# Patient Record
Sex: Female | Born: 1995 | Race: Black or African American | Hispanic: No | Marital: Single | Smoking: Never smoker
Health system: Southern US, Community
[De-identification: ages and names within clinical notes are randomized; demographics above are authoritative.]

## PROBLEM LIST (undated history)

## (undated) DIAGNOSIS — Z8619 Personal history of other infectious and parasitic diseases: Secondary | ICD-10-CM

## (undated) DIAGNOSIS — F419 Anxiety disorder, unspecified: Secondary | ICD-10-CM

## (undated) DIAGNOSIS — J45909 Unspecified asthma, uncomplicated: Secondary | ICD-10-CM

## (undated) DIAGNOSIS — F32A Depression, unspecified: Secondary | ICD-10-CM

## (undated) DIAGNOSIS — E785 Hyperlipidemia, unspecified: Secondary | ICD-10-CM

## (undated) HISTORY — DX: Personal history of other infectious and parasitic diseases: Z86.19

## (undated) HISTORY — DX: Unspecified asthma, uncomplicated: J45.909

## (undated) HISTORY — DX: Hyperlipidemia, unspecified: E78.5

## (undated) HISTORY — DX: Anxiety disorder, unspecified: F41.9

## (undated) HISTORY — PX: WISDOM TOOTH EXTRACTION: SHX21

---
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
## 2017-02-27 ENCOUNTER — Ambulatory Visit: Payer: 59 | Admitting: Internal Medicine

## 2017-03-03 ENCOUNTER — Encounter: Payer: Self-pay | Admitting: Internal Medicine

## 2017-03-03 ENCOUNTER — Ambulatory Visit (INDEPENDENT_AMBULATORY_CARE_PROVIDER_SITE_OTHER): Payer: Self-pay | Admitting: Internal Medicine

## 2017-03-03 NOTE — Progress Notes (Signed)
ERROR

## 2017-03-09 ENCOUNTER — Ambulatory Visit: Payer: Self-pay | Admitting: Internal Medicine

## 2017-04-02 ENCOUNTER — Ambulatory Visit: Payer: BLUE CROSS/BLUE SHIELD | Admitting: Internal Medicine

## 2017-04-02 ENCOUNTER — Encounter: Payer: Self-pay | Admitting: Internal Medicine

## 2017-04-02 DIAGNOSIS — F321 Major depressive disorder, single episode, moderate: Secondary | ICD-10-CM

## 2017-04-02 MED ORDER — ESCITALOPRAM OXALATE 10 MG PO TABS: 10.0000 mg | ORAL_TABLET | Freq: Every day | ORAL | 1 refills | Status: DC

## 2017-04-02 NOTE — Assessment & Plan Note (Signed)
Rx for lexapro as this did help her some while she was taking it. Explained that she would need to take this until she is in remission and then another 3-6 months so she can be stable. She will continue counseling. Note written today for her school but needs to continue treatment. Follow up in 4-6 weeks.

## 2017-04-02 NOTE — Patient Instructions (Signed)
We have sent in the lexapro to take 1 pill daily. As we talked about this can take up to 1-2 months to help all the way.   We want to see you back in 1-2 months to see how you are doing and make adjustments if we need to.   This is not a medicine you will be on for life. This can be used just to get you through this and get you back into your life.   Keep going to counseling because they can help with medicines to get you more coping skills and set boundaries with family.

## 2017-04-02 NOTE — Progress Notes (Signed)
   Subjective:    Patient ID: Barbara Daniels, female    DOB: 01-03-1996, 21 y.o.   MRN: [id number]  HPI The patient is a 21 YO female coming in for anxiety and depression. She is seeing a therapist at her school who has helped her to cope some. She was seen by PCP in the summer and given lexapro which she took for a couple of weeks, started feeling some better and then stopped. She did not want to feel like she was dependent on medication so did not take. She has little interest in activities, sleeping a lot of the time. 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 She has some troublesome family dynamics that cause her more angst. She is not getting the support from any family members and this is making her more depressed. She has thought about suicide in the past but denies active thoughts about that now. She previous was self-mutilating but is not currently doing this either. She wants to start medication and achieve her goals.   Review of Systems  Constitutional: Positive for activity change, appetite change and fatigue. Negative for chills, fever and unexpected weight change.  Respiratory: Negative for cough, chest tightness and shortness of breath.   Cardiovascular: Negative for chest pain, palpitations and leg swelling.  Gastrointestinal: Negative for abdominal distention, abdominal pain, constipation, diarrhea, nausea and vomiting.  Musculoskeletal: Negative.   Skin: Negative.   Neurological: Negative.   Psychiatric/Behavioral: Positive for behavioral problems, decreased concentration, dysphoric mood and sleep disturbance. Negative for self-injury and suicidal ideas. The patient is nervous/anxious.       Objective:   Physical Exam  Constitutional: She is oriented to person, place, and time. She appears well-developed and well-nourished.  HENT:  Head: Normocephalic and atraumatic.  Eyes: EOM are normal.  Neck: Normal range of motion.    Cardiovascular: Normal rate and regular rhythm.  Pulmonary/Chest: Effort normal and breath sounds normal. No respiratory distress. She has no wheezes. She has no rales.  Abdominal: Soft. Bowel sounds are normal. She exhibits no distension. There is no tenderness. There is no rebound.  Musculoskeletal: She exhibits no edema.  Neurological: She is alert and oriented to person, place, and time. Coordination normal.  Skin: Skin is warm and dry.  Psychiatric:  Some appropriate distress during the visit and flat affect   Vitals:   04/02/17 0955  BP: 110/80  Pulse: 67  Temp: 98.1 F (36.7 C)  TempSrc: Oral  SpO2: 98%  Weight: 233 lb (105.7 kg)  Height: 5' 9.01" (1.753 m)      Assessment & Plan:  Visit time 25 minutes: greater than 50% of that time was spent in face to face counseling and coordination of care with the patient: counseled about setting boundaries with family and coping skills as well as the nature and course of depression medications and expected length of treatment.

## 2017-05-01 ENCOUNTER — Ambulatory Visit: Payer: BLUE CROSS/BLUE SHIELD | Admitting: Nurse Practitioner

## 2017-06-26 ENCOUNTER — Ambulatory Visit (INDEPENDENT_AMBULATORY_CARE_PROVIDER_SITE_OTHER): Payer: BLUE CROSS/BLUE SHIELD | Admitting: Family Medicine

## 2017-06-26 ENCOUNTER — Encounter: Payer: Self-pay | Admitting: Family Medicine

## 2017-06-26 ENCOUNTER — Telehealth: Payer: Self-pay | Admitting: Family

## 2017-06-26 VITALS — BP 118/76 | HR 100 | Temp 98.7°F | Ht 69.02 in | Wt 240.0 lb

## 2017-06-26 DIAGNOSIS — J020 Streptococcal pharyngitis: Secondary | ICD-10-CM | POA: Diagnosis not present

## 2017-06-26 LAB — POCT RAPID STREP A (OFFICE): Rapid Strep A Screen: NEGATIVE

## 2017-06-26 MED ORDER — CLARITHROMYCIN 500 MG PO TABS: 500.0000 mg | ORAL_TABLET | Freq: Two times a day (BID) | ORAL | 0 refills | Status: AC

## 2017-06-26 MED ORDER — ERYTHROMYCIN BASE 500 MG PO TABS: 500.0000 mg | ORAL_TABLET | Freq: Three times a day (TID) | ORAL | 0 refills | Status: DC

## 2017-06-26 NOTE — Telephone Encounter (Signed)
Copied from CRM [id number]. Topic: Quick Communication - See Telephone Encounter >> Jun 26, 2017  1:35 PM Windy KalataMichael, Laylaa Guevarra L, NT wrote: CRM for notification. See Telephone encounter for:  06/26/17.  Patient grandmother is calling and states patient was prescribed erythromycin base (E-MYCIN) 500 MG tablet today 06/26/17. The meidcation is $120 and she is needing something cheaper. Patient was seen by Dr. Doreene BurkeKremer.   [facility] [id number] [place] - [address]  [address] [place] [id number]  Phone: [phone] Fax: [phone]

## 2017-06-26 NOTE — Addendum Note (Signed)
Addended by: Nadene RubinsKREMER, WILLIAM A on: 06/26/2017 03:00 PM   Modules accepted: Orders

## 2017-06-26 NOTE — Patient Instructions (Addendum)
Pharyngitis Pharyngitis is redness, pain, and swelling (inflammation) of the throat (pharynx). It is a very common cause of sore throat. Pharyngitis can be caused by a bacteria, but it is usually caused by a virus. Most cases of pharyngitis get better on their own without treatment. What are the causes? This condition may be caused by:  Infection by viruses (viral). Viral pharyngitis spreads from person to person (is contagious) through coughing, sneezing, and sharing of personal items or utensils such as cups, forks, spoons, and toothbrushes.  Infection by bacteria (bacterial). Bacterial pharyngitis may be spread by touching the nose or face after coming in contact with the bacteria, or through more intimate contact, such as kissing.  Allergies. Allergies can cause buildup of mucus in the throat (post-nasal drip), leading to inflammation and irritation. Allergies can also cause blocked nasal passages, forcing breathing through the mouth, which dries and irritates the throat.  What increases the risk? You are more likely to develop this condition if:  You are 5-24 years old.  You are exposed to crowded environments such as daycare, school, or dormitory living.  You live in a cold climate.  You have a weakened disease-fighting (immune) system.  What are the signs or symptoms? Symptoms of this condition vary by the cause (viral, bacterial, or allergies) and can include:  Sore throat.  Fatigue.  Low-grade fever.  Headache.  Joint pain and muscle aches.  Skin rashes.  Swollen glands in the throat (lymph nodes).  Plaque-like film on the throat or tonsils. This is often a symptom of bacterial pharyngitis.  Vomiting.  Stuffy nose (nasal congestion).  Cough.  Red, itchy eyes (conjunctivitis).  Loss of appetite.  How is this diagnosed? This condition is often diagnosed based on your medical history and a physical exam. Your health care provider will ask you questions about  your illness and your symptoms. A swab of your throat may be done to check for bacteria (rapid strep test). Other lab tests may also be done, depending on the suspected cause, but these are rare. How is this treated? This condition usually gets better in 3-4 days without medicine. Bacterial pharyngitis may be treated with antibiotic medicines. Follow these instructions at home:  Take over-the-counter and prescription medicines only as told by your health care provider. ? If you were prescribed an antibiotic medicine, take it as told by your health care provider. Do not stop taking the antibiotic even if you start to feel better. ? Do not give children aspirin because of the association with Reye syndrome.  Drink enough water and fluids to keep your urine clear or pale yellow.  Get a lot of rest.  Gargle with a salt-water mixture 3-4 times a day or as needed. To make a salt-water mixture, completely dissolve -1 tsp of salt in 1 cup of warm water.  If your health care provider approves, you may use throat lozenges or sprays to soothe your throat. Contact a health care provider if:  You have large, tender lumps in your neck.  You have a rash.  You cough up green, yellow-brown, or bloody spit. Get help right away if:  Your neck becomes stiff.  You drool or are unable to swallow liquids.  You cannot drink or take medicines without vomiting.  You have severe pain that does not go away, even after you take medicine.  You have trouble breathing, and it is not caused by a stuffy nose.  You have new pain and swelling in your joints   such as the knees, ankles, wrists, or elbows. Summary  Pharyngitis is redness, pain, and swelling (inflammation) of the throat (pharynx).  While pharyngitis can be caused by a bacteria, the most common causes are viral.  Most cases of pharyngitis get better on their own without treatment.  Bacterial pharyngitis is treated with antibiotic medicines. This  information is not intended to replace advice given to you by your health care provider. Make sure you discuss any questions you have with your health care provider. Document Released: 03/31/2005 Document Revised: 05/06/2016 Document Reviewed: 05/06/2016 Elsevier Interactive Patient Education  2018 [employer]  Strep Throat Strep throat is an infection of the throat. It is caused by germs. Strep throat spreads from person to person because of coughing, sneezing, or close contact. Follow these instructions at home: Medicines  Take over-the-counter and prescription medicines only as told by your doctor.  Take your antibiotic medicine as told by your doctor. Do not stop taking the medicine even if you feel better.  Have family members who also have a sore throat or fever go to a doctor. Eating and drinking  Do not share food, drinking cups, or personal items.  Try eating soft foods until your sore throat feels better.  Drink enough fluid to keep your pee (urine) clear or pale yellow. General instructions  Rinse your mouth (gargle) with a salt-water mixture 3-4 times per day or as needed. To make a salt-water mixture, stir -1 tsp of salt into 1 cup of warm water.  Make sure that all people in your house wash their hands well.  Rest.  Stay home from school or work until you have been taking antibiotics for 24 hours.  Keep all follow-up visits as told by your doctor. This is important. Contact a doctor if:  Your neck keeps getting bigger.  You get a rash, cough, or earache.  You cough up thick liquid that is green, yellow-brown, or bloody.  You have pain that does not get better with medicine.  Your problems get worse instead of getting better.  You have a fever. Get help right away if:  You throw up (vomit).  You get a very bad headache.  You neck hurts or it feels stiff.  You have chest pain or you are short of breath.  You have drooling, very bad throat pain,  or changes in your voice.  Your neck is swollen or the skin gets red and tender.  Your mouth is dry or you are peeing less than normal.  You keep feeling more tired or it is hard to wake up.  Your joints are red or they hurt. This information is not intended to replace advice given to you by your health care provider. Make sure you discuss any questions you have with your health care provider. Document Released: 09/17/2007 Document Revised: 11/28/2015 Document Reviewed: 07/24/2014 Elsevier Interactive Patient Education  2018 [employer].  Infectious Mononucleosis Infectious mononucleosis is an infection that is caused by a virus. This illness is often called "mono." You can get mono from close contact with someone who is infected (it is contagious). If you have mono, you may feel tired and have a sore throat, a headache, or a fever. Mono is usually not serious, but some people may need to be treated for it in the hospital. Follow these instructions at home: Medicines  Take over-the-counter and prescription medicines only as told by your doctor.  Do not take ampicillin or amoxicillin. This may cause a rash.  If you are under 18, do not take aspirin. Activity  Rest as needed.  Do not do any of the following activities until your doctor says that they are safe for you: ? Contact sports. You may need to wait a month or longer before you play sports. ? Exercise that requires a lot of energy. ? Lifting heavy things.  Slowly go back to your normal activities after your fever is gone, or when your doctor says that you can. Be sure to rest when you get tired. Preventing infectious mononucleosis  Avoid contact with people who have mono. An infected person may not seem sick, but he or she can still spread the virus.  Avoid sharing forks, spoons, knives (utensils), drinking cups, or toothbrushes.  Wash your hands often with soap and water. If you cannot use soap and water, use hand  sanitizer.  Use the inside of your elbow to cover your mouth when you cough or sneeze. General instructions  Avoid kissing or sharing forks, spoons, knives, or drinking cups until your doctor approves.  Drink enough fluid to keep your pee (urine) clear or pale yellow.  Do not drink alcohol.  If you have a sore throat: ? Rinse your mouth (gargle) with a salt-water mixture 3-4 times a day or as needed. To make a salt-water mixture, completely dissolve -1 tsp of salt in 1 cup of warm water. ? Eat soft foods. Cold foods such as ice cream or frozen ice pops can help your throat feel better. ? Try sucking on hard candy.  Wash your hands often with soap and water. If you cannot use soap and water, use hand sanitizer. Contact a doctor if:  Your fever is not gone after 10 days.  You have swelling by your jaw or neck (swollen lymph nodes), and the swelling does not go away after 4 weeks.  Your activity level is not back to normal after 2 months.  Your skin or the white parts of your eyes turn yellow (jaundice).  You have trouble pooping (have constipation). This may mean that you: ? Poop (have a bowel movement) fewer times in a week than normal. ? Have a hard time pooping. ? Have poop that is dry, hard, or bigger than normal. Get help right away if:  You have very bad pain in your: ? Belly (abdomen). ? Shoulder.  You are drooling.  You have trouble swallowing.  You have trouble breathing.  You have a stiff neck.  You have a very bad headache.  You cannot stop throwing up (vomiting).  You have jerky movements that you cannot control (seizures).  You are confused.  You have trouble with balance.  Your nose or gums start to bleed.  You have signs of body fluid loss (dehydration). These may include: ? Weakness. ? Sunken eyes. ? Pale skin. ? Dry mouth. ? Fast breathing or heartbeat. Summary  Infectious mononucleosis, or "mono," is an infection that is caused by a  virus.  Mono is usually not serious, but some people may need to be treated for it in the hospital.  You should not play contact sports or lift heavy things until your doctor says that you can.  Wash your hands often with soap and water. If you cannot use soap and water, use hand sanitizer. This information is not intended to replace advice given to you by your health care provider. Make sure you discuss any questions you have with your health care provider. Document Released: 03/19/2009 Document Revised:  12/18/2015 Document Reviewed: 12/18/2015 Elsevier Interactive Patient Education  2017 [employer].

## 2017-06-26 NOTE — Telephone Encounter (Signed)
rx for biaxin sent to pharm.

## 2017-06-26 NOTE — Progress Notes (Addendum)
Subjective:  Patient ID: Barbara Daniels, female    DOB: [birth year]  Age: 22 y.o. MRN: [id number]  CC: body aches, sore throat   HPI Barbara Daniels presents for evaluation of a 2-day history of sore throat with an intermittent headache and poor appetite.  Some nausea but no vomiting.  Little cough.  She has had myalgias as well.  Outpatient Medications Prior to Visit  Medication Sig Dispense Refill  . albuterol (PROVENTIL HFA;VENTOLIN HFA) 108 (90 Base) MCG/ACT inhaler Inhale 2 puffs into the lungs every 6 (six) hours as needed for wheezing or shortness of breath. 1 Inhaler 0  . escitalopram (LEXAPRO) 10 MG tablet Take 1 tablet (10 mg total) by mouth daily. 30 tablet 1  . etonogestrel (NEXPLANON) 68 MG IMPL implant 1 each by Subdermal route once.    . fluticasone (FLOVENT HFA) 44 MCG/ACT inhaler Inhale 2 puffs into the lungs 2 (two) times daily. 1 Inhaler 2   No facility-administered medications prior to visit.     ROS Review of Systems  Constitutional: Positive for fatigue and fever. Negative for chills.  HENT: Positive for sore throat and voice change. Negative for trouble swallowing.   Eyes: Negative for photophobia and visual disturbance.  Respiratory: Positive for cough. Negative for shortness of breath and wheezing.   Cardiovascular: Negative.   Gastrointestinal: Positive for nausea. Negative for abdominal distention, abdominal pain and vomiting.  Musculoskeletal: Positive for myalgias. Negative for arthralgias.  Skin: Negative for rash.  Allergic/Immunologic: Negative for immunocompromised state.  Neurological: Positive for headaches.  Hematological: Does not bruise/bleed easily.  Psychiatric/Behavioral: Negative.     Objective:  BP 118/76 (BP Location: Right Arm, Patient Position: Sitting, Cuff Size: Normal)   Pulse 100   Temp 98.7 F (37.1 C) (Oral)   Ht 5' 9.02" (1.753 m)   Wt 240 lb (108.9 kg)   SpO2 97%   BMI 35.42 kg/m   BP Readings from Last 3 Encounters:    06/26/17 118/76 (65 %, Z = 0.40 /  86 %, Z = 1.06)*  04/02/17 110/80 (30 %, Z = -0.53 /  92 %, Z = 1.39)*  11/21/16 127/82 (89 %, Z = 1.24 /  96 %, Z = 1.70)*   *BP percentiles are based on the August 2017 AAP Clinical Practice Guideline for girls    Wt Readings from Last 3 Encounters:  06/26/17 240 lb (108.9 kg) (>99 %, Z= 2.43)*  04/02/17 233 lb (105.7 kg) (>99 %, Z= 2.35)*  11/20/16 241 lb (109.3 kg) (>99 %, Z= 2.42)*   * Growth percentiles are based on CDC (Girls, 2-20 Years) data.    Physical Exam  Constitutional: She is oriented to person, place, and time. She appears well-developed and well-nourished. No distress.  HENT:  Head: Normocephalic and atraumatic.  Right Ear: External ear normal.  Left Ear: External ear normal.  Mouth/Throat: Mucous membranes are normal. Oropharyngeal exudate, posterior oropharyngeal edema and posterior oropharyngeal erythema present. No tonsillar abscesses.  Eyes: Conjunctivae are normal. Pupils are equal, round, and reactive to light. Right eye exhibits no discharge. Left eye exhibits no discharge. No scleral icterus.  Neck: Normal range of motion. Neck supple. No JVD present. No tracheal deviation present. No thyromegaly present.  Cardiovascular: Normal rate, regular rhythm and normal heart sounds.  Pulmonary/Chest: Effort normal and breath sounds normal. No stridor. No respiratory distress. She has no wheezes. She has no rales. She exhibits no tenderness.  Abdominal: Bowel sounds are normal.  Lymphadenopathy:  She has cervical adenopathy.  Neurological: She is alert and oriented to person, place, and time.  Skin: Skin is warm and dry. She is not diaphoretic.  Psychiatric: She has a normal mood and affect. Her behavior is normal.    Lab Results  Component Value Date   WBC 6.1 11/20/2016   HGB 13.4 11/20/2016   HCT 38.2 11/20/2016   PLT 277 11/20/2016   GLUCOSE 88 11/20/2016   CHOL 146 06/27/2016   TRIG 55.0 06/27/2016   HDL 42.80  06/27/2016   LDLCALC 92 06/27/2016   ALT 14 06/27/2016   AST 15 06/27/2016   NA 138 11/20/2016   K 3.9 11/20/2016   CL 105 11/20/2016   CREATININE 0.82 11/20/2016   BUN 13 11/20/2016   CO2 26 11/20/2016    No results found.  Assessment & Plan:   Barbara Daniels was seen today for body aches, sore throat.  Diagnoses and all orders for this visit:  Pharyngitis due to Streptococcus species -     POCT rapid strep A -     Discontinue: erythromycin base (E-MYCIN) 500 MG tablet; Take 1 tablet (500 mg total) by mouth 3 (three) times daily for 10 days. -     clarithromycin (BIAXIN) 500 MG tablet; Take 1 tablet (500 mg total) by mouth 2 (two) times daily for 10 days.   I have discontinued Barbara Daniels's erythromycin base. I am also having her start on clarithromycin. Additionally, I am having her maintain her etonogestrel, albuterol, fluticasone, and escitalopram.  Meds ordered this encounter  Medications  . DISCONTD: erythromycin base (E-MYCIN) 500 MG tablet    Sig: Take 1 tablet (500 mg total) by mouth 3 (three) times daily for 10 days.    Dispense:  30 tablet    Refill:  0  . clarithromycin (BIAXIN) 500 MG tablet    Sig: Take 1 tablet (500 mg total) by mouth 2 (two) times daily for 10 days.    Dispense:  20 tablet    Refill:  0   Strep throat versus early mono.  Treated with a rhythmical erythromycin.  Patient is pen allergic.  Follow-up in 1 week or sooner if worse.  Consider Monospot test as needed. Erythromycin not covered by insurance. Will send biaxin instead. ? Early mono. Fu for monospot if not imprved on biaxin.  Follow-up: Return in about 1 week (around 07/03/2017), or if symptoms worsen or fail to improve.  Mliss Sax, MD

## 2017-08-18 ENCOUNTER — Ambulatory Visit (INDEPENDENT_AMBULATORY_CARE_PROVIDER_SITE_OTHER): Payer: BLUE CROSS/BLUE SHIELD

## 2017-08-18 DIAGNOSIS — Z111 Encounter for screening for respiratory tuberculosis: Secondary | ICD-10-CM

## 2017-08-21 ENCOUNTER — Encounter: Payer: Self-pay | Admitting: Family

## 2017-08-21 ENCOUNTER — Other Ambulatory Visit (INDEPENDENT_AMBULATORY_CARE_PROVIDER_SITE_OTHER): Payer: BLUE CROSS/BLUE SHIELD

## 2017-08-21 ENCOUNTER — Ambulatory Visit (INDEPENDENT_AMBULATORY_CARE_PROVIDER_SITE_OTHER): Payer: BLUE CROSS/BLUE SHIELD | Admitting: Family

## 2017-08-21 VITALS — BP 116/80 | HR 86 | Temp 97.7°F | Ht 69.0 in | Wt 236.0 lb

## 2017-08-21 DIAGNOSIS — J4540 Moderate persistent asthma, uncomplicated: Secondary | ICD-10-CM | POA: Diagnosis not present

## 2017-08-21 DIAGNOSIS — Z0000 Encounter for general adult medical examination without abnormal findings: Secondary | ICD-10-CM | POA: Diagnosis not present

## 2017-08-21 DIAGNOSIS — Z13220 Encounter for screening for lipoid disorders: Secondary | ICD-10-CM

## 2017-08-21 DIAGNOSIS — E049 Nontoxic goiter, unspecified: Secondary | ICD-10-CM

## 2017-08-21 DIAGNOSIS — Z113 Encounter for screening for infections with a predominantly sexual mode of transmission: Secondary | ICD-10-CM

## 2017-08-21 DIAGNOSIS — Z23 Encounter for immunization: Secondary | ICD-10-CM | POA: Diagnosis not present

## 2017-08-21 LAB — LIPID PANEL
Cholesterol: 179 mg/dL (ref 0–200)
HDL: 41.8 mg/dL (ref >39.00)
LDL Cholesterol: 106 mg/dL — ABNORMAL HIGH (ref 0–99)
NONHDL: 137.08
TRIGLYCERIDES: 155 mg/dL — AB (ref 0.0–149.0)
Total CHOL/HDL Ratio: 4
VLDL: 31 mg/dL (ref 0.0–40.0)

## 2017-08-21 LAB — CBC WITH DIFFERENTIAL/PLATELET
BASOS PCT: 1.4 % (ref 0.0–3.0)
Basophils Absolute: 0.1 10*3/uL (ref 0.0–0.1)
EOS ABS: 0.1 10*3/uL (ref 0.0–0.7)
Eosinophils Relative: 1.1 % (ref 0.0–5.0)
HEMATOCRIT: 40.8 % (ref 36.0–46.0)
Hemoglobin: 14.4 g/dL (ref 12.0–15.0)
LYMPHS PCT: 35.2 % (ref 12.0–46.0)
Lymphs Abs: 2.5 10*3/uL (ref 0.7–4.0)
MCHC: 35.4 g/dL (ref 30.0–36.0)
MCV: 90.6 fl (ref 78.0–100.0)
MONO ABS: 0.7 10*3/uL (ref 0.1–1.0)
Monocytes Relative: 10.5 % (ref 3.0–12.0)
NEUTROS ABS: 3.7 10*3/uL (ref 1.4–7.7)
NEUTROS PCT: 51.8 % (ref 43.0–77.0)
PLATELETS: 280 10*3/uL (ref 150.0–400.0)
RBC: 4.5 Mil/uL (ref 3.87–5.11)
RDW: 12.9 % (ref 11.5–14.6)
WBC: 7.1 10*3/uL (ref 4.5–10.5)

## 2017-08-21 LAB — COMPREHENSIVE METABOLIC PANEL
ALBUMIN: 4.1 g/dL (ref 3.5–5.2)
ALK PHOS: 58 U/L (ref 39–117)
ALT: 14 U/L (ref 0–35)
AST: 15 U/L (ref 0–37)
BUN: 14 mg/dL (ref 6–23)
CALCIUM: 9.3 mg/dL (ref 8.4–10.5)
CHLORIDE: 102 meq/L (ref 96–112)
CO2: 28 mEq/L (ref 19–32)
CREATININE: 0.89 mg/dL (ref 0.40–1.20)
GFR: 103.83 mL/min (ref >60.00)
Glucose, Bld: 87 mg/dL (ref 70–99)
Potassium: 4.1 mEq/L (ref 3.5–5.1)
Sodium: 139 mEq/L (ref 135–145)
TOTAL PROTEIN: 7.2 g/dL (ref 6.0–8.3)
Total Bilirubin: 0.6 mg/dL (ref 0.2–1.2)

## 2017-08-21 LAB — TSH: TSH: 1.5 u[IU]/mL (ref 0.35–5.50)

## 2017-08-21 MED ORDER — FLUTICASONE PROPIONATE HFA 44 MCG/ACT IN AERO: 2.0000 | INHALATION_SPRAY | Freq: Two times a day (BID) | RESPIRATORY_TRACT | 2 refills | Status: DC

## 2017-08-21 MED ORDER — ALBUTEROL SULFATE HFA 108 (90 BASE) MCG/ACT IN AERS: 2.0000 | INHALATION_SPRAY | Freq: Four times a day (QID) | RESPIRATORY_TRACT | 2 refills | Status: DC | PRN

## 2017-08-21 NOTE — Progress Notes (Signed)
Barbara Daniels is a 22 y.o. female with the following history as recorded in EpicCare:  Patient Active Problem List   Diagnosis Date Noted  . Pharyngitis due to Streptococcus species 06/26/2017  . Current moderate episode of major depressive disorder without prior episode ([place]) 10/30/2016  . Routine adult health maintenance 06/27/2016  . Moderate persistent asthma without complication 75/44/9201  . Class 2 obesity due to excess calories without serious comorbidity with body mass index (BMI) of 35.0 to 35.9 in adult 06/27/2016    Current Outpatient Medications  Medication Sig Dispense Refill  . albuterol (PROVENTIL HFA;VENTOLIN HFA) 108 (90 Base) MCG/ACT inhaler Inhale 2 puffs into the lungs every 6 (six) hours as needed for wheezing or shortness of breath. 1 Inhaler 2  . etonogestrel (NEXPLANON) 68 MG IMPL implant 1 each by Subdermal route once.    . fluticasone (FLOVENT HFA) 44 MCG/ACT inhaler Inhale 2 puffs into the lungs 2 (two) times daily. 1 Inhaler 2   No current facility-administered medications for this visit.     Allergies: Penicillins  Past Medical History:  Diagnosis Date  . Asthma   . History of chickenpox   . Hyperlipidemia     Past Surgical History:  Procedure Laterality Date  . WISDOM TOOTH EXTRACTION      Family History  Problem Relation Age of Onset  . Diabetes Father   . Hypertension Father   . Diabetes Maternal Grandmother   . Arthritis Paternal Grandmother   . Heart failure Paternal Grandfather     Social History   Tobacco Use  . Smoking status: Never Smoker  . Smokeless tobacco: Never Used  Substance Use Topics  . 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  Head: Normocephalic and atraumatic  Eyes: Sclera and conjunctiva clear; pupils round and reactive to light; extraocular movements intact  Ears: External normal; canals clear; tympanic membranes normal  Oropharynx: Pink, supple. No suspicious lesions  Neck: Supple with thyromegaly, no adenopathy  Lungs: Respirations unlabored; clear to auscultation bilaterally without wheeze, rales, rhonchi  CVS exam: normal rate and regular rhythm.  Abdomen: Soft; nontender; nondistended; normoactive bowel sounds; no masses or hepatosplenomegaly  Musculoskeletal: No deformities; no active joint inflammation  Extremities: No edema, cyanosis, clubbing  Vessels: Symmetric bilaterally  Neurologic: Alert and oriented; speech intact; face symmetrical; moves all extremities well; CNII-XII intact without focal deficit  Assessment:  1. Enlarged thyroid   2. Moderate persistent asthma without complication   3. PE (physical exam), annual   4. Lipid screening   5. Screen for STD (sexually transmitted disease)     Plan:  Labs and refills updated as requested; paperwork completed  for employer; encouraged for GYN follow-up; follow up to be determined.   No follow-ups on file.  Orders Placed This Encounter  Procedures  . GC/Chlamydia Probe Amp([facility])    Standing Status:   Future    Number of Occurrences:   1    Standing Expiration Date:   08/22/2018  . US Soft Tissue Head/Neck    Standing Status:   Future    Standing Expiration Date:   10/22/2018     Order Specific Question:   Reason for Exam (SYMPTOM  OR DIAGNOSIS REQUIRED)    Answer:   thyroid enlarged    Order Specific Question:   Preferred imaging location?    Answer:   [facility]  . Tdap vaccine greater than or equal to 7yo IM  . CBC w/Diff    Standing Status:   Future    Number of Occurrences:   1    Standing Expiration Date:   08/21/2018  . Comp Met (CMET)    Standing Status:   Future    Number of Occurrences:   1    Standing Expiration Date:   08/21/2018  . Lipid panel    Standing Status:   Future    Number of Occurrences:   1    Standing Expiration Date:   08/22/2018  . HIV antibody    Standing Status:   Future    Number of Occurrences:   1    Standing Expiration Date:   08/22/2018  . RPR    Standing Status:   Future    Number of Occurrences:   1    Standing Expiration Date:   08/21/2018  . TSH    Standing Status:   Future    Number of Occurrences:   1    Standing Expiration Date:   08/21/2018    Requested Prescriptions   Signed Prescriptions Disp Refills  . fluticasone (FLOVENT HFA) 44 MCG/ACT inhaler 1 Inhaler 2    Sig: Inhale 2 puffs into the lungs 2 (two) times daily.  Marland Kitchen albuterol (PROVENTIL HFA;VENTOLIN HFA) 108 (90 Base) MCG/ACT inhaler 1 Inhaler 2    Sig: Inhale 2 puffs into the lungs every 6 (six) hours as needed for wheezing or shortness of breath.

## 2017-08-24 LAB — RPR: RPR Ser Ql: NONREACTIVE

## 2017-08-24 LAB — HIV ANTIBODY (ROUTINE TESTING W REFLEX): HIV 1&2 Ab, 4th Generation: NONREACTIVE

## 2017-08-25 ENCOUNTER — Encounter: Payer: Self-pay | Admitting: Family

## 2017-08-25 ENCOUNTER — Ambulatory Visit (INDEPENDENT_AMBULATORY_CARE_PROVIDER_SITE_OTHER): Payer: BLUE CROSS/BLUE SHIELD | Admitting: Family

## 2017-08-25 VITALS — BP 118/76 | HR 70 | Temp 99.0°F | Ht 69.0 in | Wt 239.1 lb

## 2017-08-25 DIAGNOSIS — Z113 Encounter for screening for infections with a predominantly sexual mode of transmission: Secondary | ICD-10-CM | POA: Diagnosis not present

## 2017-08-25 DIAGNOSIS — A549 Gonococcal infection, unspecified: Secondary | ICD-10-CM

## 2017-08-25 LAB — GC/CHLAMYDIA PROBE AMP
Chlamydia trachomatis, NAA: NEGATIVE
Neisseria gonorrhoeae by PCR: POSITIVE — AB

## 2017-08-25 MED ORDER — AZITHROMYCIN 500 MG PO TABS: 1000.0000 mg | ORAL_TABLET | Freq: Two times a day (BID) | ORAL | 0 refills | Status: DC

## 2017-08-25 NOTE — Progress Notes (Signed)
Barbara Daniels is a 22 y.o. female with the following history as recorded in EpicCare:  Patient Active Problem List   Diagnosis Date Noted  . Pharyngitis due to Streptococcus species 06/26/2017  . 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 Routine adult health maintenance 06/27/2016  . Moderate persistent asthma without complication 06/27/2016  . Class 2 obesity due to excess calories without serious comorbidity with body mass index (BMI) of 35.0 to 35.9 in adult 06/27/2016    Current Outpatient Medications  Medication Sig Dispense Refill  . albuterol (PROVENTIL HFA;VENTOLIN HFA) 108 (90 Base) MCG/ACT inhaler Inhale 2 puffs into the lungs every 6 (six) hours as needed for wheezing or shortness of breath. 1 Inhaler 2  . etonogestrel (NEXPLANON) 68 MG IMPL implant 1 each by Subdermal route once.    . fluticasone (FLOVENT HFA) 44 MCG/ACT inhaler Inhale 2 puffs into the lungs 2 (two) times daily. 1 Inhaler 2  . azithromycin (ZITHROMAX) 500 MG tablet Take 2 tablets (1,000 mg total) by mouth 2 (two) times daily. 4 tablet 0   No current facility-administered medications for this visit.     Allergies: Penicillins  Past Medical History:  Diagnosis Date  . Asthma   . History of chickenpox   . Hyperlipidemia     Past Surgical History:  Procedure Laterality Date  . WISDOM TOOTH EXTRACTION      Family History  Problem Relation Age of Onset  . Diabetes Father   . Hypertension Father   . Diabetes Maternal Grandmother   . Arthritis Paternal Grandmother   . Heart failure Paternal Grandfather     Social History   Tobacco Use  . Smoking status: Never Smoker  . Smokeless tobacco: Never Used  Substance Use Topics  . Alcohol use: No    Subjective:  Patient presents with concerns for "just not feeling well recently." Notes symptoms have been present for the past 2 weeks; complaining of recurrent headaches/ body pains and swollen lymph  nodes; does have Nexplanon in place;  Upon further questioning with patient, she does admit to recent episode of unprotected sex in the past few weeks; denies any pelvic pain, vaginal discharge, urinary pain or frequent urination;    Objective:  Vitals:   08/25/17 1458  BP: 118/76  Pulse: 70  Temp: 99 F (37.2 C)  TempSrc: Oral  SpO2: 98%  Weight: 239 lb 1.3 oz (108.4 kg)  Height:  (1.753 m)    General: Well developed, well nourished, in no acute distress  Skin : Warm and dry.  Head: Normocephalic and atraumatic  Neck: Supple without thyromegaly, adenopathy  Lungs: Respirations unlabored; clear to auscultation bilaterally without wheeze, rales, rhonchi  CVS exam: normal rate and regular rhythm.  Neurologic: Alert and oriented; speech intact; face symmetrical; moves all extremities well; CNII-XII intact without focal deficit  Assessment:  1. Gonorrhea in female   2. Screen for STD (sexually transmitted disease)     Plan:  Reviewed recent labs with patient; discussed + gonorrhea test; due to nature of patient's PCN allergy ( hives and rash), am hesitant to give her injection of Rocephin; will treat with 2gm of Zithromax per [facility] guidelines; she understands that Health Department will be contacting her; she will abstain for at least 2 weeks; stressed need to use condoms with all partners; she will plan to get her G/C test repeated in 2-3 weeks to ensure resolution.    No follow-ups on file.  Orders Placed This Encounter  Procedures  . GC/Chlamydia Probe Amp([facility])    Requested Prescriptions   Signed Prescriptions Disp Refills  . azithromycin (ZITHROMAX) 500 MG tablet 4 tablet 0    Sig: Take 2 tablets (1,000 mg total) by mouth 2 (two) times daily.

## 2017-09-04 ENCOUNTER — Other Ambulatory Visit: Payer: BLUE CROSS/BLUE SHIELD

## 2017-09-04 ENCOUNTER — Other Ambulatory Visit: Payer: Self-pay | Admitting: Family

## 2017-09-04 ENCOUNTER — Telehealth: Payer: Self-pay

## 2017-09-04 DIAGNOSIS — A549 Gonococcal infection, unspecified: Secondary | ICD-10-CM

## 2017-09-04 NOTE — Telephone Encounter (Signed)
Spoke with Gearldine Bienenstock today from [facility] Department regarding patient's positive STD screening. She was calling regarding new guidelines for patients who tested positive for Gonorrhea and wanted to know what treatment patient had been given and we carried gentamicin. Did make Brandy aware that we do not carry Gentamicin in the office but that I would contact patient to have her come back in for recheck of labs to make sure STD had cleared up and to touch basis with her to let her know results and if she needed to call patient for appointment so she could receive the Gentamicin. Brandy's call back number is [phone]. Called patient and left message for her to return call to clinic and to come back and repeat lab work. CRM created about coming back for labs incase she calls back.

## 2017-09-08 ENCOUNTER — Telehealth: Payer: Self-pay | Admitting: Family

## 2017-09-08 ENCOUNTER — Encounter: Payer: Self-pay | Admitting: Family

## 2017-09-08 NOTE — Telephone Encounter (Signed)
Patient spoke with team health on 5/25 stating she was returning phone call to the office that she had missed.  States she dropped a "sample" off on 5/24.

## 2017-09-08 NOTE — Telephone Encounter (Signed)
Called and left message for patient to come by lab and redo test. We will call her with results once they come back.

## 2017-09-09 ENCOUNTER — Other Ambulatory Visit: Payer: BLUE CROSS/BLUE SHIELD

## 2017-09-10 ENCOUNTER — Other Ambulatory Visit: Payer: BLUE CROSS/BLUE SHIELD

## 2017-09-10 DIAGNOSIS — A549 Gonococcal infection, unspecified: Secondary | ICD-10-CM

## 2017-09-11 LAB — GC/CHLAMYDIA PROBE AMP
Chlamydia trachomatis, NAA: NEGATIVE
NEISSERIA GONORRHOEAE BY PCR: NEGATIVE

## 2017-09-14 NOTE — Telephone Encounter (Signed)
Spoke with Gearldine BienenstockBrandy today and informed her of lab results from 09/10/17 that patient was now negative.

## 2017-10-05 ENCOUNTER — Other Ambulatory Visit: Payer: Self-pay

## 2017-10-05 ENCOUNTER — Emergency Department (HOSPITAL_COMMUNITY)
Admission: EM | Admit: 2017-10-05 | Discharge: 2017-10-06 | Disposition: A | Discharge status: Home / Self Care | Payer: BLUE CROSS/BLUE SHIELD | Attending: Emergency Medicine | Admitting: Emergency Medicine

## 2017-10-05 ENCOUNTER — Encounter (HOSPITAL_COMMUNITY): Payer: Self-pay | Admitting: Emergency Medicine

## 2017-10-05 DIAGNOSIS — Z79899 Other long term (current) drug therapy: Secondary | ICD-10-CM | POA: Insufficient documentation

## 2017-10-05 DIAGNOSIS — J4540 Moderate persistent asthma, uncomplicated: Secondary | ICD-10-CM | POA: Insufficient documentation

## 2017-10-05 DIAGNOSIS — L02412 Cutaneous abscess of left axilla: Secondary | ICD-10-CM | POA: Diagnosis not present

## 2017-10-05 DIAGNOSIS — R222 Localized swelling, mass and lump, trunk: Secondary | ICD-10-CM | POA: Diagnosis present

## 2017-10-05 MED ORDER — LIDOCAINE-EPINEPHRINE (PF) 2 %-1:200000 IJ SOLN
10.0000 mL | Freq: Once | INTRAMUSCULAR | Status: DC
  Filled 2017-10-05: qty 20

## 2017-10-05 NOTE — ED Provider Notes (Signed)
[facility] EMERGENCY DEPARTMENT Provider Note   CSN: [id number] Arrival date & time: 10/05/17  2136     History   Chief Complaint Chief Complaint  Patient presents with  . Abscess    HPI Barbara Daniels is a 22 y.o. female.  Barbara Daniels is a 22 y.o. Female with a history of asthma and hyperlipidemia, presents to the emergency department for evaluation of abscess in her left axilla.  She first noticed it yesterday as a small bump, but overnight it got much larger and more painful, she has not had any purulent drainage from the abscess, reports she has had some smaller ones in the past but none that have gotten this large.  She reports pain is constant and throbbing, worse with movement and palpation.  She denies any fevers or chills, nausea or vomiting.  Patient does not have history of diabetes.  Nothing for pain prior to arrival, no other aggravating or alleviating factors.     Past Medical History:  Diagnosis Date  . Asthma   . History of chickenpox   . Hyperlipidemia     Patient Active Problem List   Diagnosis Date Noted  . Pharyngitis due to Streptococcus species 06/26/2017  . 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 Routine adult health maintenance 06/27/2016  . Moderate persistent asthma without complication 06/27/2016  . Class 2 obesity due to excess calories without serious comorbidity with body mass index (BMI) of 35.0 to 35.9 in adult 06/27/2016    Past Surgical History:  Procedure Laterality Date  . WISDOM TOOTH EXTRACTION       OB History   None      Home Medications    Prior to Admission medications   Medication Sig Start Date End Date Taking? Authorizing Provider  albuterol (PROVENTIL HFA;VENTOLIN HFA) 108 (90 Base) MCG/ACT inhaler Inhale 2 puffs into the lungs every 6 (six) hours as needed for wheezing or shortness of breath. 08/21/17   Olive Bass, FNP    azithromycin (ZITHROMAX) 500 MG tablet Take 2 tablets (1,000 mg total) by mouth 2 (two) times daily. 08/25/17   Olive Bass, FNP  etonogestrel (NEXPLANON) 68 MG IMPL implant 1 each by Subdermal route once.    [provider]  fluticasone (FLOVENT HFA) 44 MCG/ACT inhaler Inhale 2 puffs into the lungs 2 (two) times daily. 08/21/17   Olive Bass, FNP    Family History Family History  Problem Relation Age of Onset  . Diabetes Father   . Hypertension Father   . Diabetes Maternal Grandmother   . Arthritis Paternal Grandmother   . Heart failure Paternal Grandfather     Social History Social History   Tobacco Use  . Smoking status: Never Smoker  . Smokeless tobacco: Never Used  Substance Use Topics  . Alcohol use: No  . Drug use: No     Allergies   Penicillins   Review of Systems Review of Systems  Constitutional: Negative for chills and fever.  Gastrointestinal: Negative for nausea and vomiting.  Skin:       Abscess     Physical Exam Updated Vital Signs BP 131/80 (BP Location: Right Arm)   Pulse 85   Temp 98.5 F (36.9 C) (Oral)   Resp 17   LMP 09/08/2017 (Exact Date)   SpO2 100%   Physical Exam  Constitutional: She appears well-developed and well-nourished. No distress.  HENT:  Head: Normocephalic and atraumatic.  Eyes: Right eye exhibits no discharge. Left eye exhibits no discharge.  Pulmonary/Chest: Effort normal. No respiratory distress.  Musculoskeletal:  2 cm area of fluctuance with whitehead present in the left axilla, with significant surrounding induration  Neurological: She is alert. Coordination normal.  Skin: Skin is warm and dry. Capillary refill takes less than 2 seconds. She is not diaphoretic.  Psychiatric: She has a normal mood and affect. Her behavior is normal.  Nursing note and vitals reviewed.    ED Treatments / Results  Labs (all labs ordered are listed, but only abnormal results are displayed) Labs  Reviewed - No data to display  EKG None  Radiology No results found.  Procedures .Marland Kitchen.Incision and Drainage Date/Time: 10/06/2017 12:14 AM Performed by: Dartha LodgeFord, Devita Nies N, PA-C Authorized by: Dartha LodgeFord, Marylu Dudenhoeffer N, PA-C   Consent:    Consent obtained:  Verbal   Consent given by:  Patient   Risks discussed:  Bleeding, incomplete drainage, infection, damage to other organs and pain   Alternatives discussed:  No treatment Location:    Type:  Abscess   Size:  2 cm   Location:  Upper extremity   Upper extremity location: L axilla. Pre-procedure details:    Skin preparation:  Betadine Anesthesia (see MAR for exact dosages):    Anesthesia method:  Local infiltration   Local anesthetic:  Lidocaine 2% WITH epi Procedure type:    Complexity:  Simple Procedure details:    Incision types:  Single straight   Incision depth:  Dermal   Scalpel blade:  11   Wound management:  Probed and deloculated and irrigated with saline   Drainage:  Purulent and bloody   Drainage amount:  Moderate   Wound treatment:  Wound left open   Packing materials:  None Post-procedure details:    Patient tolerance of procedure:  Tolerated well, no immediate complications   (including critical care time)  Medications Ordered in ED Medications  lidocaine-EPINEPHrine (XYLOCAINE W/EPI) 2 %-1:200000 (PF) injection 10 mL (has no administration in time range)     Initial Impression / Assessment and Plan / ED Course  I have reviewed the triage vital signs and the nursing notes.  Pertinent labs & imaging results that were available during my care of the patient were reviewed by me and considered in my medical decision making (see chart for details).  Patient with skin abscess amenable to incision and drainage.  Abscess was not large enough to warrant packing or drain,  wound recheck in 2 days with PCP. Encouraged home warm soaks and flushing.  Moderate surrounding cellulitis with significant induration surrounding the  abscess, will treat with doxycycline.  Will d/c to home.  Final Clinical Impressions(s) / ED Diagnoses   Final diagnoses:  Abscess of left axilla    ED Discharge Orders        Ordered    doxycycline (VIBRAMYCIN) 100 MG capsule  2 times daily     10/06/17 0018       Dartha LodgeFord, Marvis Bakken N, PA-C 10/06/17 0131    Shon BatonHorton, Courtney F, MD 10/06/17 [phone]

## 2017-10-05 NOTE — ED Triage Notes (Signed)
Patient to ED c/o abscess under L arm since yesterday. Denies fevers/chills.

## 2017-10-06 MED ORDER — DOXYCYCLINE HYCLATE 100 MG PO CAPS: 100.0000 mg | ORAL_CAPSULE | Freq: Two times a day (BID) | ORAL | 0 refills | Status: DC

## 2017-10-06 NOTE — Discharge Instructions (Signed)
Please take entire course of antibiotics as directed.  The area will continue to drain over the next few days and should heal from the inside out.  Please do warm compresses multiple times a day to help promote drainage and to help reduce the surrounding inflammation, this inflammation and swelling surrounding the abscess should improve with antibiotics.  Please follow-up with your primary care doctor in 2 days for a wound check.  Return to the emergency department if you develop fevers, significantly worsening pain and swelling or large amounts of drainage from the area.

## 2017-11-30 ENCOUNTER — Ambulatory Visit: Payer: Self-pay | Admitting: *Deleted

## 2017-11-30 ENCOUNTER — Other Ambulatory Visit: Payer: BLUE CROSS/BLUE SHIELD

## 2017-11-30 ENCOUNTER — Encounter: Payer: Self-pay | Admitting: Nurse Practitioner

## 2017-11-30 ENCOUNTER — Ambulatory Visit (INDEPENDENT_AMBULATORY_CARE_PROVIDER_SITE_OTHER): Payer: Medicaid Other | Admitting: Nurse Practitioner

## 2017-11-30 VITALS — BP 110/72 | HR 91 | Temp 98.4°F | Ht 69.0 in | Wt 242.0 lb

## 2017-11-30 DIAGNOSIS — R140 Abdominal distension (gaseous): Secondary | ICD-10-CM | POA: Diagnosis not present

## 2017-11-30 DIAGNOSIS — F419 Anxiety disorder, unspecified: Secondary | ICD-10-CM | POA: Diagnosis not present

## 2017-11-30 DIAGNOSIS — N926 Irregular menstruation, unspecified: Secondary | ICD-10-CM

## 2017-11-30 DIAGNOSIS — K5900 Constipation, unspecified: Secondary | ICD-10-CM | POA: Diagnosis not present

## 2017-11-30 DIAGNOSIS — E049 Nontoxic goiter, unspecified: Secondary | ICD-10-CM

## 2017-11-30 DIAGNOSIS — F32A Depression, unspecified: Secondary | ICD-10-CM

## 2017-11-30 DIAGNOSIS — F329 Major depressive disorder, single episode, unspecified: Secondary | ICD-10-CM

## 2017-11-30 NOTE — Progress Notes (Signed)
Name: Barbara Daniels   MRN: [id number]    DOB: Oct 16, 1995   Date:11/30/2017       Progress Note  Subjective  Chief Complaint Multiple complaints  HPI  Barbara Daniels is here today for evaluation of several acute complaints- anxiety and depression, abdominal pain and bloating, irregular periods and constipation. Barbara Daniels says her symptoms have been occurring for some time now, maybe even a year, but worse recently. Barbara Daniels says Barbara Daniels was maintained on antidepressants in the past, stopped taking them because Barbara Daniels didn't want to depend on something to make her feel happy. Barbara Daniels finds Barbara Daniels is often forgetful, cant focus, worries all day about things Barbara Daniels can not control. Barbara Daniels says Barbara Daniels has not thought of hurting herself or others. [facility] but has been unable to schedule with them for follow up. Barbara Daniels reports feeling fatigued, nauseated and lightheaded at times. Denies syncope, fevers, weakness, chest pain, shortness of breath, urinary frequency, dysuria, falls. Barbara Daniels is currently awaiting upcoming thyroid ultrasound for enlarged thyroid with normal TSH noted on office visit in May.  Patient Active Problem List   Diagnosis Date Noted  . Pharyngitis due to Streptococcus species 06/26/2017  . 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 Routine adult health maintenance 06/27/2016  . Moderate persistent asthma without complication 06/27/2016  . Class 2 obesity due to excess calories without serious comorbidity with body mass index (BMI) of 35.0 to 35.9 in adult 06/27/2016    Social History   Tobacco Use  . Smoking status: Never Smoker  . Smokeless tobacco: Never Used  Substance Use Topics  . Alcohol use: No     Current Outpatient Medications:  .  albuterol (PROVENTIL HFA;VENTOLIN HFA) 108 (90 Base) MCG/ACT inhaler, Inhale 2 puffs into the lungs every 6 (six) hours as needed for wheezing or shortness  of breath., Disp: 1 Inhaler, Rfl: 2 .  etonogestrel (NEXPLANON) 68 MG IMPL implant, 1 each by Subdermal route once., Disp: , Rfl:  .  fluticasone (FLOVENT HFA) 44 MCG/ACT inhaler, Inhale 2 puffs into the lungs 2 (two) times daily. (Patient not taking: Reported on 11/30/2017), Disp: 1 Inhaler, Rfl: 2  Allergies  Allergen Reactions  . Penicillins Hives    ROS  No other specific complaints in a complete review of systems (except as listed in HPI above).  Objective  Vitals:   11/30/17 1522  BP: 110/72  Pulse: 91  Temp: 98.4 F (36.9 C)  TempSrc: Oral  SpO2: 97%  Weight: 242 lb (109.8 kg)  Height: 5\' 9"  (1.753 m)    Body mass index is 35.74 kg/m.  Nursing Note and Vital Signs reviewed.  Physical Exam  Constitutional: Patient appears well-developed and well-nourished. No distress.  HEENT: head atraumatic, normocephalic, pupils equal and reactive to light, EOM's intact, TM's without erythema or bulging, oropharynx pink and moist without exudate Neck: Normal range of motion. Neck supple with thyromegaly present. No cervical adenopathy. Cardiovascular: Normal rate, regular rhythm, S1/S2 present.  Distal pulses intact. Pulmonary/Chest: Effort normal and breath sounds clear. No respiratory distress or retractions. Musculoskeletal: Normal range of motion,   No gross deformities Neurological: Barbara Daniels is alert and oriented to person, place, and time. No cranial nerve deficit. Coordination, balance, strength, speech and gait are normal.  Skin: Skin is warm and dry. No rash noted. No erythema.  Psychiatric: Patient appears anxious. behavior is normal. Judgment and thought content normal.  Assessment & Plan RTC in 1 month for follow up: abdominal bloating and constipation-trying home measures; anxiety and depression-referral to counseling 08/21/17 TSH, CMET, CBC WNL  Irregular menses Barbara Daniels [facility], we discussed gynecology  referral today to have her establish with new GYN for care since Barbara Daniels is not able [facility] and Barbara Daniels is agreeable - Ambulatory referral to Gynecology  Abdominal bloating, Constipation, unspecified constipation type Additional diagnostic testing ordered today -home management, Red flags and when to present for emergency care or RTC including fever >101.56F,   new/worsening/un-resolving symptoms,  reviewed with patient at time of visit. Follow up and care instructions discussed and provided in AVS. RTC in 1 month for Follow up - Gliadin antibodies, serum; Future - Tissue transglutaminase, IgA; Future - Reticulin Antibody, IgA w reflex titer; Future  Anxiety and depression Discussed treatment options for anxiety and depression and Barbara Daniels would like to try counseling, referral placed Suspect her uncontrolled anxiety and depression could be contributing to her symptoms which we discussed today RTC in 1 month for follow up - Ambulatory referral to Psychology  Enlarged thyroid continue with thyroid US as scheduled

## 2017-11-30 NOTE — Patient Instructions (Signed)
Please head downstairs for lab work. If any of your test results are critically abnormal, you will be contacted right away. Otherwise, I will contact you within a week about your test results and follow up recommendations  Add fiber supplement once daily.  Add a probiotic (such as Florastor) daily. Drink 64 oz of water a day. Eat lots of fresh fruit and veggies. Ensure regular exercise.    If you are not able to have regular BM's with the above regimen, you may add miralax 1 tablespoon daily.  Increase or decrease amount/frequency as needed to ensure 1 soft BM/day.   I have placed a referral to gynecology for follow up of your IUD and irregular periods. Our office will begin processing this referral. Please follow up if you have not heard anything about this referral within 10 days.  Please return in about 1 month for follow up, so we can see how you are doing.   Constipation, Adult Constipation is when a person:  Poops (has a bowel movement) fewer times in a week than normal.  Has a hard time pooping.  Has poop that is dry, hard, or bigger than normal.  Follow these instructions at home: Eating and drinking   Eat foods that have a lot of fiber, such as: ? Fresh fruits and vegetables. ? Whole grains. ? Beans.  Eat less of foods that are high in fat, low in fiber, or overly processed, such as: ? [place] fries. ? Hamburgers. ? Cookies. ? Candy. ? Soda.  Drink enough fluid to keep your pee (urine) clear or pale yellow. General instructions  Exercise regularly or as told by your doctor.  Go to the restroom when you feel like you need to poop. Do not hold it in.  Take over-the-counter and prescription medicines only as told by your doctor. These include any fiber supplements.  Do pelvic floor retraining exercises, such as: ? Doing deep breathing while relaxing your lower belly (abdomen). ? Relaxing your pelvic floor while pooping.  Watch your condition for any  changes.  Keep all follow-up visits as told by your doctor. This is important. Contact a doctor if:  You have pain that gets worse.  You have a fever.  You have not pooped for 4 days.  You throw up (vomit).  You are not hungry.  You lose weight.  You are bleeding from the anus.  You have thin, pencil-like poop (stool). Get help right away if:  You have a fever, and your symptoms suddenly get worse.  You leak poop or have blood in your poop.  Your belly feels hard or bigger than normal (is bloated).  You have very bad belly pain.  You feel dizzy or you faint. This information is not intended to replace advice given to you by your health care provider. Make sure you discuss any questions you have with your health care provider. Document Released: 09/17/2007 Document Revised: 10/19/2015 Document Reviewed: 09/19/2015 Elsevier Interactive Patient Education  2018 [employer]

## 2017-11-30 NOTE — Telephone Encounter (Signed)
Called pt regarding symptoms; she states that she has been having problems remembering things, nausea x 3 days; weight changes  (she feels bloated and stomach is pudging); also says period has been irregular; and constipation; her last BM was 2 days ago ; she says she has been anxious and depressed for a couple of months but she denies thoughts of harm to herself or others; she says the MD prescribed her anti-drepressants but she did not like the way they that they made her feel; her greatest compliant is the constipation; she says that she has a thyroid ultra sound scheduled on 09/02/17 and can she move this up; explained to pt that this procedure is separate from an MD's appointment;  recommendations given per nurse triage protocol to include home care for constipation and seeing a physician within 3 days; [facility] but she has no availability; pt offered and accepted appointment with Alphonse GuildAshleigh Shambley. [facility], today at 1530; she verbalizes understanding; will route to office for notification of this upcoming appointment.  Reason for Disposition . Mild constipation  Answer Assessment - Initial Assessment Questions 1. STOOL PATTERN OR FREQUENCY: "How often do you pass bowel movements (BMs)?"  (Normal range: tid to q 3 days)  "When was the last BM passed?"       Daily; last one 2 days ago 2. STRAINING: "Do you have to strain to have a BM?"      yes 3. RECTAL PAIN: "Does your rectum hurt when the stool comes out?" If so, ask: "Do you have hemorrhoids? How bad is the pain?"  (Scale 1-10; or mild, moderate, severe)     no 4. STOOL COMPOSITION: "Are the stools hard?"      no 5. BLOOD ON STOOLS: "Has there been any blood on the toilet tissue or on the surface of the BM?" If so, ask: "When was the last time?"      \no 6. CHRONIC CONSTIPATION: "Is this a new problem for you?"  If no, ask: "How long have you had this problem?" (days, weeks, months)      Yes when in elementary  school 7. CHANGES IN DIET: "Have there been any recent changes in your diet?"      Imbalanced meals 8. MEDICATIONS: "Have you been taking any new medications?"     no 9. LAXATIVES: "Have you been using any laxatives or enemas?"  If yes, ask "What, how often, and when was the last time?"     no 10. CAUSE: "What do you think is causing the constipation?"        unsure 11. OTHER SYMPTOMS: "Do you have any other symptoms?" (e.g., abdominal pain, fever, vomiting)       Abdominal pain, nausea, bloating   12. PREGNANCY: "Is there any chance you are pregnant?" "When was your last menstrual period?"       No, has implant; LMP 07/21/17  Protocols used: CONSTIPATION-A-AH

## 2017-12-01 ENCOUNTER — Encounter: Payer: Self-pay | Admitting: Nurse Practitioner

## 2017-12-01 ENCOUNTER — Telehealth: Payer: Self-pay | Admitting: Family

## 2017-12-01 NOTE — Telephone Encounter (Signed)
-----   Message from Ottis Stainerri L Slaugenhaupt, CCC-SLP sent at 12/01/2017 10:37 AM EDT ----- FYI:  Your patient is scheduled to see Delight Ovenssman Mostafa on 12/18/17. Pt is aware.  Thank you for the referral.

## 2017-12-03 LAB — GLIADIN ANTIBODIES, SERUM
Gliadin IgA: 4 Units
Gliadin IgG: 2 Units

## 2017-12-03 LAB — RETICULIN ANTIBODIES, IGA W TITER: Reticulin IGA Screen: NEGATIVE

## 2017-12-03 LAB — TISSUE TRANSGLUTAMINASE, IGA: (TTG) AB, IGA: 1 U/mL

## 2017-12-04 ENCOUNTER — Ambulatory Visit
Admission: RE | Admit: 2017-12-04 | Discharge: 2017-12-04 | Disposition: A | Discharge status: Home / Self Care | Payer: Medicaid Other | Source: Ambulatory Visit | Attending: Family | Admitting: Family

## 2017-12-04 DIAGNOSIS — E049 Nontoxic goiter, unspecified: Secondary | ICD-10-CM

## 2017-12-08 ENCOUNTER — Other Ambulatory Visit: Payer: Self-pay | Admitting: Family

## 2017-12-08 DIAGNOSIS — R221 Localized swelling, mass and lump, neck: Secondary | ICD-10-CM

## 2017-12-15 ENCOUNTER — Other Ambulatory Visit: Payer: Self-pay | Admitting: Nurse Practitioner

## 2017-12-15 DIAGNOSIS — N926 Irregular menstruation, unspecified: Secondary | ICD-10-CM

## 2017-12-18 ENCOUNTER — Ambulatory Visit (INDEPENDENT_AMBULATORY_CARE_PROVIDER_SITE_OTHER)
Admission: RE | Admit: 2017-12-18 | Discharge: 2017-12-18 | Disposition: A | Discharge status: Home / Self Care | Payer: Medicaid Other | Source: Ambulatory Visit | Attending: Family | Admitting: Family

## 2017-12-18 ENCOUNTER — Ambulatory Visit: Payer: Medicaid Other | Admitting: Psychology

## 2017-12-18 DIAGNOSIS — R221 Localized swelling, mass and lump, neck: Secondary | ICD-10-CM

## 2017-12-18 MED ORDER — IOPAMIDOL (ISOVUE-300) INJECTION 61%
75.0000 mL | Freq: Once | INTRAVENOUS | Status: AC | PRN
  Administered 2017-12-18: 75 mL via INTRAVENOUS

## 2017-12-21 ENCOUNTER — Encounter: Payer: Self-pay | Admitting: General Practice

## 2017-12-21 ENCOUNTER — Other Ambulatory Visit: Payer: Self-pay | Admitting: Family

## 2017-12-21 ENCOUNTER — Telehealth: Payer: Self-pay | Admitting: General Practice

## 2017-12-21 DIAGNOSIS — R590 Localized enlarged lymph nodes: Secondary | ICD-10-CM

## 2017-12-21 NOTE — Telephone Encounter (Signed)
Left message on Voicemail for patient to give our office a call back in regards to appt scheduled for 01/28/18 at 1:15pm

## 2018-01-01 ENCOUNTER — Ambulatory Visit: Payer: BLUE CROSS/BLUE SHIELD | Admitting: Family

## 2018-01-01 DIAGNOSIS — Z0289 Encounter for other administrative examinations: Secondary | ICD-10-CM

## 2018-01-28 ENCOUNTER — Encounter: Payer: Self-pay | Admitting: General Practice

## 2018-01-28 ENCOUNTER — Encounter: Payer: Self-pay | Admitting: Obstetrics and Gynecology

## 2018-02-03 ENCOUNTER — Ambulatory Visit: Payer: Self-pay | Admitting: Family

## 2018-02-03 ENCOUNTER — Other Ambulatory Visit: Payer: Medicaid Other

## 2018-02-03 ENCOUNTER — Ambulatory Visit (INDEPENDENT_AMBULATORY_CARE_PROVIDER_SITE_OTHER): Payer: Medicaid Other | Admitting: Family

## 2018-02-03 ENCOUNTER — Encounter: Payer: Self-pay | Admitting: Family

## 2018-02-03 VITALS — BP 114/68 | HR 63 | Temp 97.6°F | Ht 69.0 in | Wt 246.0 lb

## 2018-02-03 DIAGNOSIS — F32A Depression, unspecified: Secondary | ICD-10-CM

## 2018-02-03 DIAGNOSIS — Z113 Encounter for screening for infections with a predominantly sexual mode of transmission: Secondary | ICD-10-CM

## 2018-02-03 DIAGNOSIS — N926 Irregular menstruation, unspecified: Secondary | ICD-10-CM | POA: Diagnosis not present

## 2018-02-03 DIAGNOSIS — F419 Anxiety disorder, unspecified: Secondary | ICD-10-CM | POA: Diagnosis not present

## 2018-02-03 DIAGNOSIS — F329 Major depressive disorder, single episode, unspecified: Secondary | ICD-10-CM | POA: Diagnosis not present

## 2018-02-03 MED ORDER — ESCITALOPRAM OXALATE 10 MG PO TABS: 10.0000 mg | ORAL_TABLET | Freq: Every day | ORAL | 3 refills | Status: DC

## 2018-02-03 NOTE — Progress Notes (Signed)
Barbara Daniels is a 22 y.o. female with the following history as recorded in EpicCare:  Patient Active Problem List   Diagnosis Date Noted  . Pharyngitis due to Streptococcus species 06/26/2017  . 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 Routine adult health maintenance 06/27/2016  . Moderate persistent asthma without complication 06/27/2016  . Class 2 obesity due to excess calories without serious comorbidity with body mass index (BMI) of 35.0 to 35.9 in adult 06/27/2016    Current Outpatient Medications  Medication Sig Dispense Refill  . albuterol (PROVENTIL HFA;VENTOLIN HFA) 108 (90 Base) MCG/ACT inhaler Inhale 2 puffs into the lungs every 6 (six) hours as needed for wheezing or shortness of breath. 1 Inhaler 2  . etonogestrel (NEXPLANON) 68 MG IMPL implant 1 each by Subdermal route once.    . fluticasone (FLOVENT HFA) 44 MCG/ACT inhaler Inhale 2 puffs into the lungs 2 (two) times daily. 1 Inhaler 2  . escitalopram (LEXAPRO) 10 MG tablet Take 1 tablet (10 mg total) by mouth daily. 30 tablet 3   No current facility-administered medications for this visit.     Allergies: Penicillins  Past Medical History:  Diagnosis Date  . Asthma   . History of chickenpox   . Hyperlipidemia     Past Surgical History:  Procedure Laterality Date  . WISDOM TOOTH EXTRACTION      Family History  Problem Relation Age of Onset  . Diabetes Father   . Hypertension Father   . Diabetes Maternal Grandmother   . Arthritis Paternal Grandmother   . Heart failure Paternal Grandfather     Social History   Tobacco Use  . Smoking status: Never Smoker  . Smokeless tobacco: Never Used  Substance Use Topics  . Alcohol use: No    Subjective:  Presents with concerns for STD exposure- requesting to get STD testing done again; denies any vaginal discharge; admits very upset since testing positive for gonorrhea earlier this summer; continuing to  struggle  with anxiety and depression; could not afford to see counselor as discussed at last OV as does not currently have health insurance; has taken Lexapro in the past and done well- would be interested in re-starting the medication; Also has been having increased spotting/ bleeding recently x 3 weeks; Nexplanon is 57+ years old; missed recent GYN evaluation; would be agreeable [facility].    Objective:  Vitals:   02/03/18 1026  BP: 114/68  Pulse: 63  Temp: 97.6 F (36.4 C)  TempSrc: Oral  SpO2: 98%  Weight: 246 lb (111.6 kg)  Height: 5\' 9"  (1.753 m)    General: Well developed, well nourished, in no acute distress  Skin : Warm and dry.  Head: Normocephalic and atraumatic  Eyes: Sclera and conjunctiva clear; pupils round and reactive to light; extraocular movements intact  Ears: External normal; canals clear; tympanic membranes normal  Oropharynx: Pink, supple. No suspicious lesions  Neck: Supple without thyromegaly, adenopathy  Lungs: Respirations unlabored; Vessels: Symmetric bilaterally  Neurologic: Alert and oriented; speech intact; face symmetrical; moves all extremities well; CNII-XII intact without focal deficit   Assessment:  1. Anxiety and depression   2. Screen for STD (sexually transmitted disease)   3. Irregular menstrual bleeding     Plan:  1. Re-start Lexapro 10 mg daily; follow-up in 1 month to evaluate response; call back if cost prohibitive. 2. Update labs as requested; 3. [facility] will [facility] since she does  not currently have insurance; Nexplanon needs to be replaced;   Return in about 1 month (around 03/06/2018).  Orders Placed This Encounter  Procedures  . GC/Chlamydia Probe Amp([facility])    Standing Status:   Future    Number of Occurrences:   1    Standing Expiration Date:   02/04/2019  . HIV Antibody (routine testing w rflx)    Standing Status:   Future    Number of Occurrences:   1    Standing Expiration Date:   02/04/2019   . RPR    Standing Status:   Future    Number of Occurrences:   1    Standing Expiration Date:   02/03/2019    Requested Prescriptions   Signed Prescriptions Disp Refills  . escitalopram (LEXAPRO) 10 MG tablet 30 tablet 3    Sig: Take 1 tablet (10 mg total) by mouth daily.

## 2018-02-04 LAB — RPR: RPR Ser Ql: NONREACTIVE

## 2018-02-04 LAB — GC/CHLAMYDIA PROBE AMP
Chlamydia trachomatis, NAA: NEGATIVE
NEISSERIA GONORRHOEAE BY PCR: NEGATIVE

## 2018-02-04 LAB — HIV ANTIBODY (ROUTINE TESTING W REFLEX): HIV 1&2 Ab, 4th Generation: NONREACTIVE

## 2018-04-08 ENCOUNTER — Other Ambulatory Visit: Payer: Self-pay

## 2018-04-08 ENCOUNTER — Encounter (HOSPITAL_COMMUNITY): Payer: Self-pay | Admitting: Emergency Medicine

## 2018-04-08 ENCOUNTER — Emergency Department (HOSPITAL_COMMUNITY)
Admission: EM | Admit: 2018-04-08 | Discharge: 2018-04-08 | Disposition: A | Discharge status: Home / Self Care | Payer: Medicaid Other | Attending: Emergency Medicine | Admitting: Emergency Medicine

## 2018-04-08 DIAGNOSIS — Z79899 Other long term (current) drug therapy: Secondary | ICD-10-CM | POA: Insufficient documentation

## 2018-04-08 DIAGNOSIS — R51 Headache: Secondary | ICD-10-CM | POA: Diagnosis present

## 2018-04-08 DIAGNOSIS — J020 Streptococcal pharyngitis: Secondary | ICD-10-CM | POA: Diagnosis not present

## 2018-04-08 DIAGNOSIS — J45909 Unspecified asthma, uncomplicated: Secondary | ICD-10-CM | POA: Diagnosis not present

## 2018-04-08 MED ORDER — PENICILLIN G BENZATHINE & PROC 1200000 UNIT/2ML IM SUSP
1.2000 10*6.[IU] | Freq: Once | INTRAMUSCULAR | Status: AC
  Administered 2018-04-08: 1.2 10*6.[IU] via INTRAMUSCULAR
  Filled 2018-04-08: qty 2

## 2018-04-08 NOTE — ED Triage Notes (Signed)
Pt reports fever, cough, sore throat, headache and body aches since Tuesday. Pt reports her friend had strep throat. Pt reports tempt at home was 101.3. Pt reports taking ibuprofen and mucinex without relief.

## 2018-04-08 NOTE — Discharge Instructions (Signed)
Follow up with your Family doc.  Return for inability to swallow.   Take tylenol 2 pills 4 times a day and motrin 4 pills 3 times a day.  Drink plenty of fluids.  Return for worsening shortness of breath, headache, confusion. Follow up with your family doctor.

## 2018-04-08 NOTE — ED Provider Notes (Signed)
[facility] Provider Note   CSN: [id number] Arrival date & time: 04/08/18  1644     History   Chief Complaint Chief Complaint  Patient presents with  . Influenza    HPI Barbara Daniels is a 22 y.o. female.  22 yo F with a chief complaint of sore throat and fevers.  Going on for the past 3 to 4 days.  The patient had a friend that was recently diagnosed with strep throat.  She had shared drink with her at some point.  She denies other sick contacts.  Has had a mild cough.  Mild headache when she coughs.  Denies vomiting denies diarrhea denies abdominal pain.  The history is provided by the patient.  Influenza  Presenting symptoms: headache   Presenting symptoms: no fever, no myalgias, no nausea, no rhinorrhea, no shortness of breath and no vomiting   Associated symptoms: no chills and no congestion   Illness  This is a new problem. The current episode started 2 days ago. The problem occurs constantly. The problem has been rapidly worsening. Associated symptoms include headaches. Pertinent negatives include no chest pain, no abdominal pain and no shortness of breath. The symptoms are aggravated by swallowing. Nothing relieves the symptoms. She has tried nothing for the symptoms. The treatment provided no relief.    Past Medical History:  Diagnosis Date  . Asthma   . History of chickenpox   . Hyperlipidemia     Patient Active Problem List   Diagnosis Date Noted  . Pharyngitis due to Streptococcus species 06/26/2017  . 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 Routine adult health maintenance 06/27/2016  . Moderate persistent asthma without complication 06/27/2016  . Class 2 obesity due to excess calories without serious comorbidity with body mass index (BMI) of 35.0 to 35.9 in adult 06/27/2016    Past Surgical History:  Procedure Laterality Date  . WISDOM TOOTH EXTRACTION        OB History   No obstetric history on file.      Home Medications    Prior to Admission medications   Medication Sig Start Date End Date Taking? Authorizing Provider  albuterol (PROVENTIL HFA;VENTOLIN HFA) 108 (90 Base) MCG/ACT inhaler Inhale 2 puffs into the lungs every 6 (six) hours as needed for wheezing or shortness of breath. 08/21/17   Olive BassMurray, Laura Woodruff, FNP  escitalopram (LEXAPRO) 10 MG tablet Take 1 tablet (10 mg total) by mouth daily. 02/03/18   Olive BassMurray, Laura Woodruff, FNP  etonogestrel (NEXPLANON) 68 MG IMPL implant 1 each by Subdermal route once.    [provider]  fluticasone (FLOVENT HFA) 44 MCG/ACT inhaler Inhale 2 puffs into the lungs 2 (two) times daily. 08/21/17   Olive BassMurray, Laura Woodruff, FNP    Family History Family History  Problem Relation Age of Onset  . Diabetes Father   . Hypertension Father   . Diabetes Maternal Grandmother   . Arthritis Paternal Grandmother   . Heart failure Paternal Grandfather     Social History Social History   Tobacco Use  . Smoking status: Never Smoker  . Smokeless tobacco: Never Used  Substance Use Topics  . Alcohol use: No  . Drug use: No     Allergies   Patient has no active allergies.   Review of Systems Review of Systems  Constitutional: Negative for chills and fever.  HENT: Negative for congestion and rhinorrhea.   Eyes: Negative for redness  and visual disturbance.  Respiratory: Negative for shortness of breath and wheezing.   Cardiovascular: Negative for chest pain and palpitations.  Gastrointestinal: Negative for abdominal pain, nausea and vomiting.  Genitourinary: Negative for dysuria and urgency.  Musculoskeletal: Negative for arthralgias and myalgias.  Skin: Negative for pallor and wound.  Neurological: Positive for headaches. Negative for dizziness.     Physical Exam Updated Vital Signs BP 133/79 (BP Location: Right Arm)   Pulse 86   Temp 99.3 F (37.4 C) (Oral)   Resp 16   LMP  03/25/2018   SpO2 98%   Physical Exam Vitals signs and nursing note reviewed.  Constitutional:      General: She is not in acute distress.    Appearance: She is well-developed. She is not diaphoretic.  HENT:     Head: Normocephalic and atraumatic.     Mouth/Throat:   Eyes:     Pupils: Pupils are equal, round, and reactive to light.  Neck:     Musculoskeletal: Normal range of motion and neck supple.  Cardiovascular:     Rate and Rhythm: Normal rate and regular rhythm.     Heart sounds: No murmur. No friction rub. No gallop.   Pulmonary:     Effort: Pulmonary effort is normal.     Breath sounds: No wheezing or rales.  Abdominal:     General: There is no distension.     Palpations: Abdomen is soft.     Tenderness: There is no abdominal tenderness.  Musculoskeletal:        General: No tenderness.  Skin:    General: Skin is warm and dry.  Neurological:     Mental Status: She is alert and oriented to person, place, and time.  Psychiatric:        Behavior: Behavior normal.      ED Treatments / Results  Labs (all labs ordered are listed, but only abnormal results are displayed) Labs Reviewed - No data to display  EKG None  Radiology No results found.  Procedures Procedures (including critical care time)  Medications Ordered in ED Medications  penicillin g procaine-penicillin g benzathine (BICILLIN-CR) injection 600000-600000 units (1.2 Million Units Intramuscular Given 04/08/18 1834)     Initial Impression / Assessment and Plan / ED Course  I have reviewed the triage vital signs and the nursing notes.  Pertinent labs & imaging results that were available during my care of the patient were reviewed by me and considered in my medical decision making (see chart for details).     22 yo F with a recent exposure to strep pharyngitis with a clinical exam consistent with the same.  We will presumptively treat.  The patient has a history of a penicillin allergy that  this was many years ago and she is not sure of the exact reaction.  Has been able to take amoxicillin afterwards without a reaction.  I discussed multiple ways of treatment at this point she would like to try the Bicillin shot.  Given without reaction.  D/c home.   6:49 PM:  I have discussed the diagnosis/risks/treatment options with the patient and family and believe the pt to be eligible for discharge home to follow-up with PCP. We also discussed returning to the ED immediately if new or worsening sx occur. We discussed the sx which are most concerning (e.g., sudden worsening pain, fever, inability to tolerate by mouth) that necessitate immediate return. Medications administered to the patient during their visit and any new prescriptions provided  to the patient are listed below.  Medications given during this visit Medications  penicillin g procaine-penicillin g benzathine (BICILLIN-CR) injection 600000-600000 units (1.2 Million Units Intramuscular Given 04/08/18 1834)      The patient appears reasonably screen and/or stabilized for discharge and I doubt any other medical condition or other [facility] requiring further screening, evaluation, or treatment in the ED at this time prior to discharge.    Final Clinical Impressions(s) / ED Diagnoses   Final diagnoses:  Strep pharyngitis    ED Discharge Orders    None       Melene Plan, DO 04/08/18 1849

## 2018-05-20 ENCOUNTER — Ambulatory Visit (HOSPITAL_COMMUNITY)
Admission: RE | Admit: 2018-05-20 | Discharge: 2018-05-20 | Disposition: A | Discharge status: Home / Self Care | Payer: Medicaid Other | Attending: Psychiatry | Admitting: Psychiatry

## 2018-05-20 NOTE — H&P (Signed)
Behavioral Health Medical Screening Exam  Barbara Daniels is an 23 y.o. female patient presents to [facility] as walk in with complaints of worsening anxiety starting to get paranoid that someone is watching her.  Patient denies suicidal/self-harm/homicidal ideation and psychosis.  States that she  Is having some paranoia that someone is watching her.    Total Time spent with patient: 30 minutes  Psychiatric Specialty Exam: Physical Exam  Vitals reviewed. Constitutional: She is oriented to person, place, and time. She appears well-developed and well-nourished.  Neck: Normal range of motion. Neck supple.  Respiratory: Effort normal.  Musculoskeletal: Normal range of motion.  Neurological: She is alert and oriented to person, place, and time.  Skin: Skin is warm and dry.  Psychiatric: Her speech is normal and behavior is normal. Judgment and thought content normal. Her mood appears anxious. Cognition and memory are normal. She exhibits a depressed mood.    Review of Systems  Constitutional: Negative.   Psychiatric/Behavioral: Positive for depression (Stable). Negative for hallucinations, memory loss and suicidal ideas. Substance abuse: THC. The patient is nervous/anxious (Stable but feel it is worsening). The patient does not have insomnia.   All other systems reviewed and are negative.   Blood pressure 130/78, pulse 94, temperature 98.5 F (36.9 C), resp. rate 20, SpO2 100 %.There is no height or weight on file to calculate BMI.  General Appearance: Casual  Eye Contact:  Good  Speech:  Clear and Coherent and Normal Rate  Volume:  Normal  Mood:  Anxious  Affect:  Appropriate and Congruent  Thought Process:  Coherent and Goal Directed  Orientation:  Full (Time, Place, and Person)  Thought Content:  WDL and Logical  Suicidal Thoughts:  No  Homicidal Thoughts:  No  Memory:  Immediate;   Good Recent;   Good Remote;   Good  Judgement:  Intact  Insight:  Present  Psychomotor Activity:   Normal  Concentration: Concentration: Fair and Attention Span: Fair  Recall:  Good  Fund of Knowledge:Good  Language: Good  Akathisia:  No  Handed:  Right  AIMS (if indicated):     Assets:  Communication Skills Desire for Improvement Housing Physical Health Social Support  Sleep:       Musculoskeletal: Strength & Muscle Tone: within normal limits Gait & Station: normal Patient leans: N/A  Blood pressure 130/78, pulse 94, temperature 98.5 F (36.9 C), resp. rate 20, SpO2 100 %.  Recommendations:  Outpatient psychiatric services  Disposition: No evidence of imminent risk to self or others at present.   Patient does not meet criteria for psychiatric inpatient admission. Supportive therapy provided about ongoing stressors. Refer to IOP. Discussed crisis plan, support from social network, calling 911, coming to the Emergency Department, and calling Suicide Hotline.  Based on my evaluation the patient does not appear to have an emergency medical condition.  Dhruvan Gullion, NP 05/20/2018, 1:25 PM
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
## 2018-08-19 ENCOUNTER — Telehealth: Payer: Self-pay | Admitting: Family

## 2018-08-19 ENCOUNTER — Other Ambulatory Visit: Payer: Self-pay | Admitting: Internal Medicine

## 2018-08-19 DIAGNOSIS — F32A Depression, unspecified: Secondary | ICD-10-CM

## 2018-08-19 DIAGNOSIS — F329 Major depressive disorder, single episode, unspecified: Secondary | ICD-10-CM

## 2018-08-19 DIAGNOSIS — F419 Anxiety disorder, unspecified: Principal | ICD-10-CM

## 2018-08-19 MED ORDER — ESCITALOPRAM OXALATE 10 MG PO TABS: 10.0000 mg | ORAL_TABLET | Freq: Every day | ORAL | 1 refills | Status: DC

## 2018-08-19 NOTE — Telephone Encounter (Signed)
Copied from CRM [id number]. Topic: Quick Communication - Rx Refill/Question >> Aug 19, 2018 11:28 AM Gwenlyn Fudge A wrote: Medication: escitalopram (LEXAPRO) 10 MG tablet   Has the patient contacted their pharmacy? No. (Agent: If no, request that the patient contact the pharmacy for the refill.) (Agent: If yes, when and what did the pharmacy advise?)  Preferred Pharmacy (with phone number or street name): [id number] - [place], [place] - [address] AT [facility] [address]  [place] [address] Phone: [phone] Fax: [phone] Not a 24 hour pharmacy; exact hours not known.    Agent: Please be advised that RX refills may take up to 3 business days. We ask that you follow-up with your pharmacy.

## 2018-09-07 ENCOUNTER — Telehealth: Payer: Self-pay | Admitting: Family

## 2018-09-07 NOTE — Telephone Encounter (Signed)
I am so sorry to hear about the loss of her father. Yes, she can increase the Lexapro to 20 mg daily. She can take 2 of what she has. She needs to be seen in the next month- if she can't come here, she needs to establish with a provider who takes her Medicaid.

## 2018-09-07 NOTE — Telephone Encounter (Signed)
Copied from CRM [id number]. Topic: Quick Communication - Rx Refill/Question >> Sep 07, 2018  9:28 AM Wyonia Hough E wrote: Medication: escitalopram (LEXAPRO) 10 MG tablet - Pt wants to know if her dose can be increased due to her losing her father this weekend and she has been experiencing diarrhea and breaking out. Pt doesn't have insurance at this time other than medicaid and wants to know if Ria Clock will increase her dose at this time/ please advise

## 2018-09-07 NOTE — Telephone Encounter (Signed)
Spoke with patient today and info given. 

## 2018-10-20 ENCOUNTER — Encounter (HOSPITAL_COMMUNITY): Payer: Self-pay | Admitting: Emergency Medicine

## 2018-10-20 ENCOUNTER — Emergency Department (HOSPITAL_COMMUNITY)
Admission: EM | Admit: 2018-10-20 | Discharge: 2018-10-20 | Disposition: A | Discharge status: Home / Self Care | Payer: Medicaid Other | Attending: Emergency Medicine | Admitting: Emergency Medicine

## 2018-10-20 DIAGNOSIS — K625 Hemorrhage of anus and rectum: Secondary | ICD-10-CM

## 2018-10-20 DIAGNOSIS — J45909 Unspecified asthma, uncomplicated: Secondary | ICD-10-CM | POA: Diagnosis not present

## 2018-10-20 LAB — COMPREHENSIVE METABOLIC PANEL
ALT: 20 U/L (ref 0–44)
AST: 20 U/L (ref 15–41)
Albumin: 3.8 g/dL (ref 3.5–5.0)
Alkaline Phosphatase: 57 U/L (ref 38–126)
Anion gap: 8 (ref 5–15)
BUN: 15 mg/dL (ref 6–20)
CO2: 23 mmol/L (ref 22–32)
Calcium: 9.2 mg/dL (ref 8.9–10.3)
Chloride: 109 mmol/L (ref 98–111)
Creatinine, Ser: 0.9 mg/dL (ref 0.44–1.00)
GFR calc Af Amer: >60 mL/min (ref >60)
GFR calc non Af Amer: >60 mL/min (ref >60)
Glucose, Bld: 108 mg/dL — ABNORMAL HIGH (ref 70–99)
Potassium: 3.7 mmol/L (ref 3.5–5.1)
Sodium: 140 mmol/L (ref 135–145)
Total Bilirubin: 1 mg/dL (ref 0.3–1.2)
Total Protein: 7.1 g/dL (ref 6.5–8.1)

## 2018-10-20 LAB — TYPE AND SCREEN
ABO/RH(D): O POS
Antibody Screen: NEGATIVE

## 2018-10-20 LAB — CBC
HCT: 40.8 % (ref 36.0–46.0)
Hemoglobin: 14.2 g/dL (ref 12.0–15.0)
MCH: 31.6 pg (ref 26.0–34.0)
MCHC: 34.8 g/dL (ref 30.0–36.0)
MCV: 90.7 fL (ref 80.0–100.0)
Platelets: 275 10*3/uL (ref 150–400)
RBC: 4.5 MIL/uL (ref 3.87–5.11)
RDW: 12.5 % (ref 11.5–15.5)
WBC: 5.8 10*3/uL (ref 4.0–10.5)
nRBC: 0 % (ref 0.0–0.2)

## 2018-10-20 LAB — POC OCCULT BLOOD, ED: Fecal Occult Bld: NEGATIVE

## 2018-10-20 LAB — I-STAT BETA HCG BLOOD, ED (MC, WL, AP ONLY): I-stat hCG, quantitative: <5 m[IU]/mL (ref <5)

## 2018-10-20 MED ORDER — POLYETHYLENE GLYCOL 3350 17 G PO PACK
17.0000 g | PACK | Freq: Every day | ORAL | Status: DC
  Administered 2018-10-20: 17 g via ORAL
  Filled 2018-10-20: qty 1

## 2018-10-20 NOTE — ED Notes (Signed)
Pt states she is also very stressed because her father just passed she also reports drinking a lot of red Gatorade.

## 2018-10-20 NOTE — ED Triage Notes (Signed)
Pt reports at 1230 today she felt like she needed to have a BM and when she did it was "a lot of blood" pt states it was reddish pink. Pt denies any pain currently she just feels lightheaded.

## 2018-10-20 NOTE — ED Notes (Signed)
Pt stands on own ability. Minor light-headed feelings x1 day, not necessarily upon standing.

## 2018-10-20 NOTE — ED Provider Notes (Signed)
[facility] Provider Note   CSN: [id number] Arrival date & time: 10/20/18  1326   History   Chief Complaint Chief Complaint  Patient presents with  . GI Bleeding    HPI Barbara Daniels is a 23 y.o. female.     HPI   23 year old female presents today with complaints of blood per rectum.  Patient notes that this morning she had a bowel movement that was soft with blood in it.  She notes she had another one while in the waiting room.  She notes it was blood in the toilet bowl.  She notes she is coming off her menstrual cycle but did not feel that this was coming from her vagina.  She denies any pain with defecation, notes she has been constipated recently.  She denies any abdominal pain fever weight loss or night sweats.  No history of the same.      Past Medical History:  Diagnosis Date  . Asthma   . History of chickenpox   . Hyperlipidemia     Patient Active Problem List   Diagnosis Date Noted  . Pharyngitis due to Streptococcus species 06/26/2017  . 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 Routine adult health maintenance 06/27/2016  . Moderate persistent asthma without complication 06/27/2016  . Class 2 obesity due to excess calories without serious comorbidity with body mass index (BMI) of 35.0 to 35.9 in adult 06/27/2016    Past Surgical History:  Procedure Laterality Date  . WISDOM TOOTH EXTRACTION       OB History   No obstetric history on file.      Home Medications    Prior to Admission medications   Medication Sig Start Date End Date Taking? Authorizing Provider  escitalopram (LEXAPRO) 10 MG tablet Take 1 tablet (10 mg total) by mouth daily. 08/19/18  Yes Etta GrandchildJones, Thomas L, MD  etonogestrel (NEXPLANON) 68 MG IMPL implant 1 each by Subdermal route once.   Yes [provider]    Family History Family History  Problem Relation Age of Onset  . Diabetes  Father   . Hypertension Father   . Diabetes Maternal Grandmother   . Arthritis Paternal Grandmother   . Heart failure Paternal Grandfather     Social History Social History   Tobacco Use  . Smoking status: Never Smoker  . Smokeless tobacco: Never Used  Substance Use Topics  . Alcohol use: No  . Drug use: No     Allergies   Patient has no known allergies.   Review of Systems Review of Systems  All other systems reviewed and are negative.    Physical Exam Updated Vital Signs BP 118/85 (BP Location: Left Arm)   Pulse (!) 56   Temp 98.6 F (37 C) (Oral)   Resp 16   Ht 5\' 9"  (1.753 m)   Wt 109.3 kg   SpO2 100%   BMI 35.59 kg/m   Physical Exam Vitals signs and nursing note reviewed.  Constitutional:      Appearance: She is well-developed.  HENT:     Head: Normocephalic and atraumatic.  Eyes:     General: No scleral icterus.       Right eye: No discharge.        Left eye: No discharge.     Conjunctiva/sclera: Conjunctivae normal.     Pupils: Pupils are equal, round, and reactive to light.  Neck:     Musculoskeletal:  Normal range of motion.     Vascular: No JVD.     Trachea: No tracheal deviation.  Pulmonary:     Effort: Pulmonary effort is normal.     Breath sounds: No stridor.  Genitourinary:    Comments: No external lesions of the rectum, internal exam with no tenderness masses or blood Neurological:     Mental Status: She is alert and oriented to person, place, and time.     Coordination: Coordination normal.  Psychiatric:        Behavior: Behavior normal.        Thought Content: Thought content normal.        Judgment: Judgment normal.      ED Treatments / Results  Labs (all labs ordered are listed, but only abnormal results are displayed) Labs Reviewed  COMPREHENSIVE METABOLIC PANEL - Abnormal; Notable for the following components:      Result Value   Glucose, Bld 108 (*)    All other components within normal limits  CBC  POC OCCULT  BLOOD, ED  I-STAT BETA HCG BLOOD, ED ([facility], [facility], [facility] ONLY)  TYPE AND SCREEN  ABO/RH    EKG None  Radiology No results found.  Procedures Procedures (including critical care time)  Medications Ordered in ED Medications  polyethylene glycol (MIRALAX / GLYCOLAX) packet 17 g (17 g Oral Given 10/20/18 1741)     Initial Impression / Assessment and Plan / ED Course  I have reviewed the triage vital signs and the nursing notes.  Pertinent labs & imaging results that were available during my care of the patient were reviewed by me and considered in my medical decision making (see chart for details).        23 year old female presents today with rectal bleeding.  Patient has no signs of significant upper or lower gastrointestinal bleed.  She had no blood on digital rectal exam and no Hemoccult positive stool.  Question internal hemorrhoids given recent constipation history.  Labs reassuring.  She will use MiraLAX as needed for constipation if symptoms persist she will follow-up with a primary care if they worsen she will return immediately to the emergency room.  Verbalized understanding and agreement to this plan had no further questions or concerns at time of discharge.  Final Clinical Impressions(s) / ED Diagnoses   Final diagnoses:  Rectal bleeding    ED Discharge Orders    None       Francee Gentile 10/20/18 1756    Little, Wenda Overland, MD 10/21/18 1416

## 2018-10-20 NOTE — Discharge Instructions (Signed)
Please read attached information. If you experience any new or worsening signs or symptoms please return to the emergency room for evaluation. Please follow-up with your primary care provider or specialist as discussed. Please use medication prescribed only as directed and discontinue taking if you have any concerning signs or symptoms.   °

## 2018-10-21 LAB — ABO/RH: ABO/RH(D): O POS

## 2018-11-03 ENCOUNTER — Emergency Department (HOSPITAL_COMMUNITY)
Admission: EM | Admit: 2018-11-03 | Discharge: 2018-11-03 | Disposition: A | Discharge status: Home / Self Care | Payer: Medicaid Other | Attending: Emergency Medicine | Admitting: Emergency Medicine

## 2018-11-03 ENCOUNTER — Encounter (HOSPITAL_COMMUNITY): Payer: Self-pay

## 2018-11-03 ENCOUNTER — Emergency Department (HOSPITAL_COMMUNITY): Payer: Medicaid Other

## 2018-11-03 DIAGNOSIS — Y9289 Other specified places as the place of occurrence of the external cause: Secondary | ICD-10-CM | POA: Diagnosis not present

## 2018-11-03 DIAGNOSIS — Y9389 Activity, other specified: Secondary | ICD-10-CM | POA: Diagnosis not present

## 2018-11-03 DIAGNOSIS — S46811A Strain of other muscles, fascia and tendons at shoulder and upper arm level, right arm, initial encounter: Secondary | ICD-10-CM | POA: Diagnosis not present

## 2018-11-03 DIAGNOSIS — J45909 Unspecified asthma, uncomplicated: Secondary | ICD-10-CM | POA: Diagnosis not present

## 2018-11-03 DIAGNOSIS — X500XXA Overexertion from strenuous movement or load, initial encounter: Secondary | ICD-10-CM | POA: Diagnosis not present

## 2018-11-03 DIAGNOSIS — Y990 Civilian activity done for income or pay: Secondary | ICD-10-CM | POA: Diagnosis not present

## 2018-11-03 DIAGNOSIS — S46911A Strain of unspecified muscle, fascia and tendon at shoulder and upper arm level, right arm, initial encounter: Secondary | ICD-10-CM

## 2018-11-03 DIAGNOSIS — S4991XA Unspecified injury of right shoulder and upper arm, initial encounter: Secondary | ICD-10-CM | POA: Diagnosis present

## 2018-11-03 MED ORDER — METHOCARBAMOL 500 MG PO TABS: 500.0000 mg | ORAL_TABLET | Freq: Two times a day (BID) | ORAL | 0 refills | Status: DC | PRN

## 2018-11-03 NOTE — ED Triage Notes (Signed)
Pt states that she has been having R shoulder pain for the past four weeks, reports heavy lifting at work. Pain is getting worse.

## 2018-11-03 NOTE — ED Provider Notes (Signed)
[facility] Provider Note   CSN: [id number] Arrival date & time: 11/03/18  2048     History   Chief Complaint Chief Complaint  Patient presents with  . Shoulder Pain    HPI Barbara Daniels is a 23 y.o. female.     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  The history is provided by the patient. No language interpreter was used.  Shoulder Pain Location:  Shoulder Shoulder location:  R shoulder Injury: no   Pain details:    Quality:  Sharp   Radiates to:  R shoulder   Severity:  Moderate   Onset quality:  Gradual   Duration:  4 weeks   Timing:  Intermittent   Progression:  Waxing and waning Handedness:  Right-handed Dislocation: no   Prior injury to area:  No Relieved by:  Nothing Worsened by:  Movement Ineffective treatments:  NSAIDs Associated symptoms: no decreased range of motion, no fever, no muscle weakness and no swelling     Past Medical History:  Diagnosis Date  . Asthma   . History of chickenpox   . Hyperlipidemia     Patient Active Problem List   Diagnosis Date Noted  . Pharyngitis due to Streptococcus species 06/26/2017  . 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 Routine adult health maintenance 06/27/2016  . Moderate persistent asthma without complication 06/27/2016  . Class 2 obesity due to excess calories without serious comorbidity with body mass index (BMI) of 35.0 to 35.9 in adult 06/27/2016    Past Surgical History:  Procedure Laterality Date  . WISDOM TOOTH EXTRACTION       OB History   No obstetric history on file.      Home Medications    Prior to Admission medications   Medication Sig Start Date End Date Taking? Authorizing Provider  escitalopram (LEXAPRO) 10 MG tablet Take 1 tablet (10 mg total) by mouth daily. 08/19/18   Etta GrandchildJones, Thomas L, MD  etonogestrel  (NEXPLANON) 68 MG IMPL implant 1 each by Subdermal route once.    [provider]  methocarbamol (ROBAXIN) 500 MG tablet Take 1 tablet (500 mg total) by mouth every 12 (twelve) hours as needed for muscle spasms. 11/03/18   Antony MaduraHumes, Shadaya Marschner, PA-C    Family History Family History  Problem Relation Age of Onset  . Diabetes Father   . Hypertension Father   . Diabetes Maternal Grandmother   . Arthritis Paternal Grandmother   . Heart failure Paternal Grandfather     Social History Social History   Tobacco Use  . Smoking status: Never Smoker  . Smokeless tobacco: Never Used  Substance Use Topics  . Alcohol use: No  . Drug use: No     Allergies   Patient has no known allergies.   Review of Systems Review of Systems  Constitutional: Negative for fever.  Musculoskeletal: Positive for arthralgias.  Ten systems reviewed and are negative for acute change, except as noted in the HPI.     Physical Exam Updated Vital Signs BP (!) 141/61 (BP Location: Left Arm)   Pulse 79   Temp 98.1 F (36.7 C) (Oral)   Resp 16   SpO2 98%   Physical Exam Vitals signs and nursing note reviewed.  Constitutional:      General: She is not in acute distress.    Appearance: She is well-developed. She  is not diaphoretic.     Comments: Nontoxic appearing and in NAD  HENT:     Head: Normocephalic and atraumatic.  Eyes:     General: No scleral icterus.    Conjunctiva/sclera: Conjunctivae normal.  Neck:     Musculoskeletal: Normal range of motion.  Cardiovascular:     Rate and Rhythm: Normal rate and regular rhythm.     Pulses: Normal pulses.     Comments: Distal radial pulse 2+ in the RUE Pulmonary:     Effort: Pulmonary effort is normal. No respiratory distress.     Comments: Respirations even and unlabored Musculoskeletal: Normal range of motion.     Right shoulder: She exhibits tenderness and pain. She exhibits normal range of motion, no bony tenderness, no deformity and normal  strength.       Arms:  Skin:    General: Skin is warm and dry.     Coloration: Skin is not pale.     Findings: No erythema or rash.  Neurological:     Mental Status: She is alert and oriented to person, place, and time.     Coordination: Coordination normal.     Comments: Patient has 5/5 grip strength in the R hand with 5/5 strength against resistance in all major muscle groups of the RUE. Sensation to light touch intact.  Psychiatric:        Behavior: Behavior normal.      ED Treatments / Results  Labs (all labs ordered are listed, but only abnormal results are displayed) Labs Reviewed - No data to display  EKG None  Radiology Dg Shoulder Right  Result Date: 11/03/2018 CLINICAL DATA:  Shoulder pain following heavy lifting, initial encounter EXAM: RIGHT SHOULDER - 2+ VIEW COMPARISON:  None. FINDINGS: There is no evidence of fracture or dislocation. There is no evidence of arthropathy or other focal bone abnormality. Soft tissues are unremarkable. IMPRESSION: No acute fracture or dislocation is noted. No soft tissue abnormality is seen. Electronically Signed   By: Inez Catalina M.D.   On: 11/03/2018 21:17    Procedures Procedures (including critical care time)  Medications Ordered in ED Medications - No data to display   Initial Impression / Assessment and Plan / ED Course  I have reviewed the triage vital signs and the nursing notes.  Pertinent labs & imaging results that were available during my care of the patient were reviewed by me and considered in my medical decision making (see chart for details).        Patient presents to the emergency department for evaluation of atraumatic R shoulder pain x 4 weeks. 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  Patient neurovascularly intact on exam. Imaging negative for fracture, dislocation, bony deformity. No swelling, erythema, heat to touch to the  affected area; no concern for septic joint. Compartments in the affected extremity are soft. Plan for supportive management including RICE and NSAIDs; primary care follow up as needed. Return precautions discussed and provided. Patient discharged in stable condition with no unaddressed concerns.   Final Clinical Impressions(s) / ED Diagnoses   Final diagnoses:  Shoulder strain, right, initial encounter    ED Discharge Orders         Ordered    methocarbamol (ROBAXIN) 500 MG tablet  Every 12 hours PRN     11/03/18 2252           Antonietta Breach, PA-C 11/03/18 2325  Melene PlanFloyd, Dan, DO 11/03/18 2330

## 2018-11-03 NOTE — Discharge Instructions (Addendum)
Alternate ice and heat to areas of injury 3-4 times per day to limit inflammation and spasm.  Continue with frequent shoulder stretching. We recommend consistent use of naproxen (Aleve) in addition to Robaxin for muscle spasms.  Do not drive or drink alcohol after taking Robaxin as it may make you drowsy and impair your judgment.  We recommend follow-up with a primary care doctor to ensure resolution of symptoms.  Return to the ED for any new or concerning symptoms.

## 2018-11-05 DIAGNOSIS — T7421XA Adult sexual abuse, confirmed, initial encounter: Secondary | ICD-10-CM | POA: Insufficient documentation

## 2018-11-05 DIAGNOSIS — J029 Acute pharyngitis, unspecified: Secondary | ICD-10-CM | POA: Insufficient documentation

## 2018-11-05 DIAGNOSIS — J45909 Unspecified asthma, uncomplicated: Secondary | ICD-10-CM | POA: Insufficient documentation

## 2018-11-05 DIAGNOSIS — Z202 Contact with and (suspected) exposure to infections with a predominantly sexual mode of transmission: Secondary | ICD-10-CM | POA: Diagnosis not present

## 2018-11-05 DIAGNOSIS — E785 Hyperlipidemia, unspecified: Secondary | ICD-10-CM | POA: Diagnosis not present

## 2018-11-05 DIAGNOSIS — Z79899 Other long term (current) drug therapy: Secondary | ICD-10-CM | POA: Diagnosis not present

## 2018-11-06 ENCOUNTER — Encounter (HOSPITAL_COMMUNITY): Payer: Self-pay | Admitting: Emergency Medicine

## 2018-11-06 ENCOUNTER — Other Ambulatory Visit: Payer: Self-pay

## 2018-11-06 ENCOUNTER — Emergency Department (HOSPITAL_COMMUNITY)
Admission: EM | Admit: 2018-11-06 | Discharge: 2018-11-06 | Disposition: A | Discharge status: Home / Self Care | Payer: Medicaid Other | Attending: Emergency Medicine | Admitting: Emergency Medicine

## 2018-11-06 DIAGNOSIS — Z202 Contact with and (suspected) exposure to infections with a predominantly sexual mode of transmission: Secondary | ICD-10-CM

## 2018-11-06 DIAGNOSIS — J029 Acute pharyngitis, unspecified: Secondary | ICD-10-CM

## 2018-11-06 LAB — URINALYSIS, ROUTINE W REFLEX MICROSCOPIC
Bacteria, UA: NONE SEEN
Bilirubin Urine: NEGATIVE
Glucose, UA: NEGATIVE mg/dL
Hgb urine dipstick: NEGATIVE
Ketones, ur: NEGATIVE mg/dL
Nitrite: NEGATIVE
Protein, ur: NEGATIVE mg/dL
Specific Gravity, Urine: 1.029 (ref 1.005–1.030)
pH: 5 (ref 5.0–8.0)

## 2018-11-06 LAB — GROUP A STREP BY PCR: Group A Strep by PCR: NOT DETECTED

## 2018-11-06 MED ORDER — STERILE WATER FOR INJECTION IJ SOLN
INTRAMUSCULAR | Status: AC
  Filled 2018-11-06: qty 10

## 2018-11-06 MED ORDER — CEFTRIAXONE SODIUM 250 MG IJ SOLR
250.0000 mg | Freq: Once | INTRAMUSCULAR | Status: AC
  Administered 2018-11-06: 250 mg via INTRAMUSCULAR
  Filled 2018-11-06: qty 250

## 2018-11-06 MED ORDER — AZITHROMYCIN 250 MG PO TABS
1000.0000 mg | ORAL_TABLET | Freq: Once | ORAL | Status: AC
  Administered 2018-11-06: 1000 mg via ORAL
  Filled 2018-11-06: qty 4

## 2018-11-06 NOTE — Discharge Instructions (Signed)
You have been effectively treated for gonorrhea and chlamydia. Your rapid strep test was negative.  The other culture should come back in about 48-72 hours, you will be contacted if results are abnormal. Can follow-up with OB/GYN or health department if any ongoing issues.
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
## 2018-11-06 NOTE — ED Triage Notes (Signed)
Patient states that she may have been exposed to an STD and states that she has a sore throat.  Patient states she has had fevers and broke out into a sweat.

## 2018-11-07 LAB — URINE CULTURE: Culture: 20000 — AB

## 2018-11-09 ENCOUNTER — Telehealth (HOSPITAL_COMMUNITY): Payer: Self-pay

## 2018-11-09 LAB — GC/CHLAMYDIA PROBE AMP (~~LOC~~) NOT AT ARMC
Chlamydia: NEGATIVE
Neisseria Gonorrhea: NEGATIVE

## 2019-07-23 ENCOUNTER — Ambulatory Visit: Payer: Medicaid Other | Attending: Internal Medicine

## 2019-07-23 DIAGNOSIS — Z23 Encounter for immunization: Secondary | ICD-10-CM

## 2019-07-23 NOTE — Progress Notes (Signed)
   Covid-19 Vaccination Clinic  Name:  KINESHA AUTEN    MRN: [id number] DOB: Nov 24, 1995  07/23/2019  Ms. Lindamood was observed post Covid-19 immunization for 15 minutes without incident. She was provided with Vaccine Information Sheet and instruction to access the V-Safe system.   Ms. Dillon was instructed to call 911 with any severe reactions post vaccine: Marland Kitchen Difficulty breathing  . Swelling of face and throat  . A fast heartbeat  . A bad rash all over body  . Dizziness and weakness   Immunizations Administered    Name Date Dose VIS Date Route   Moderna COVID-19 Vaccine 07/23/2019 12:34 PM 0.5 mL 03/15/2019 Intramuscular   Manufacturer: Moderna   Lot: [id number]   NDC: 06816-619-69

## 2019-08-20 ENCOUNTER — Ambulatory Visit: Payer: Medicaid Other | Attending: Internal Medicine

## 2019-08-20 DIAGNOSIS — Z23 Encounter for immunization: Secondary | ICD-10-CM

## 2019-08-20 NOTE — Progress Notes (Signed)
   Covid-19 Vaccination Clinic  Name:  Barbara Daniels    MRN: [id number] DOB: [birth year]  08/20/2019  [name] was observed post Covid-19 immunization for 15 minutes without incident. She was provided with Vaccine Information Sheet and instruction to access the V-Safe system.   [name] was instructed to call 911 with any severe reactions post vaccine: Marland Kitchen Difficulty breathing  . Swelling of face and throat  . A fast heartbeat  . A bad rash all over body  . Dizziness and weakness   Immunizations Administered    Name Date Dose VIS Date Route   Moderna COVID-19 Vaccine 08/20/2019 12:50 PM 0.5 mL 03/2019 Intramuscular   Manufacturer: Moderna   Lot: [id number]   NDC: 75102-585-27

## 2020-02-25 ENCOUNTER — Other Ambulatory Visit: Payer: Self-pay

## 2020-02-25 ENCOUNTER — Ambulatory Visit: Payer: Medicaid Other | Attending: Internal Medicine

## 2020-02-25 DIAGNOSIS — Z23 Encounter for immunization: Secondary | ICD-10-CM

## 2020-02-25 NOTE — Progress Notes (Signed)
   Covid-19 Vaccination Clinic  Name:  Barbara Daniels    MRN: [id number] DOB: 1995-08-07  02/25/2020  Barbara Daniels was observed post Covid-19 immunization for 15 minutes without incident. She was provided with Vaccine Information Sheet and instruction to access the V-Safe system.   Barbara Daniels was instructed to call 911 with any severe reactions post vaccine: Marland Kitchen Difficulty breathing  . Swelling of face and throat  . A fast heartbeat  . A bad rash all over body  . Dizziness and weakness   Immunizations Administered    No immunizations on file.

## 2020-08-06 ENCOUNTER — Ambulatory Visit (HOSPITAL_COMMUNITY): Payer: Self-pay

## 2020-09-14 ENCOUNTER — Ambulatory Visit: Payer: Medicaid Other | Admitting: Internal Medicine

## 2020-09-14 ENCOUNTER — Other Ambulatory Visit: Payer: Self-pay

## 2020-09-14 ENCOUNTER — Encounter: Payer: Self-pay | Admitting: Internal Medicine

## 2020-09-14 VITALS — BP 122/82 | HR 64 | Temp 98.4°F | Resp 18 | Ht 69.0 in | Wt 276.2 lb

## 2020-09-14 DIAGNOSIS — F411 Generalized anxiety disorder: Secondary | ICD-10-CM | POA: Diagnosis not present

## 2020-09-14 DIAGNOSIS — Z0000 Encounter for general adult medical examination without abnormal findings: Secondary | ICD-10-CM

## 2020-09-14 DIAGNOSIS — Z0001 Encounter for general adult medical examination with abnormal findings: Secondary | ICD-10-CM

## 2020-09-14 DIAGNOSIS — J4520 Mild intermittent asthma, uncomplicated: Secondary | ICD-10-CM

## 2020-09-14 LAB — CBC
HCT: 39.3 % (ref 36.0–46.0)
Hemoglobin: 13.7 g/dL (ref 12.0–15.0)
MCHC: 34.9 g/dL (ref 30.0–36.0)
MCV: 89.8 fl (ref 78.0–100.0)
Platelets: 273 10*3/uL (ref 150.0–400.0)
RBC: 4.38 Mil/uL (ref 3.87–5.11)
RDW: 12.8 % (ref 11.5–15.5)
WBC: 6.3 10*3/uL (ref 4.0–10.5)

## 2020-09-14 LAB — HEMOGLOBIN A1C: Hgb A1c MFr Bld: 5.3 % (ref 4.6–6.5)

## 2020-09-14 MED ORDER — CITALOPRAM HYDROBROMIDE 10 MG PO TABS: 20.0000 mg | ORAL_TABLET | Freq: Every day | ORAL | 3 refills | Status: DC

## 2020-09-14 NOTE — Assessment & Plan Note (Signed)
Flu shot yearly. Covid-19 up to date. Tetanus up to Pap smear getting with gyn and is due. Counseled about sun safety and mole surveillance. Counseled about the dangers of distracted driving. Given 10 year screening recommendations.

## 2020-09-14 NOTE — Progress Notes (Signed)
   Subjective:   Patient ID: Barbara Daniels, female    DOB: 1995/08/14, 25 y.o.   MRN: [id number]  HPI The patient is a 25 YO female coming in for follow up anxiety. Was on lexapro for a time did not feel it helped. Is anxious and tries other coping skills with limited success. Feels she may need something to help.  Also requests physical. PMH, FMH, social history reviewed and updated  Review of Systems  Constitutional: Negative.   HENT: Negative.   Eyes: Negative.   Respiratory: Negative for cough, chest tightness and shortness of breath.   Cardiovascular: Negative for chest pain, palpitations and leg swelling.  Gastrointestinal: Negative for abdominal distention, abdominal pain, constipation, diarrhea, nausea and vomiting.  Musculoskeletal: Negative.   Skin: Negative.   Neurological: Negative.   Psychiatric/Behavioral: The patient is nervous/anxious.     Objective:  Physical Exam Constitutional:      Appearance: She is well-developed. She is obese.  HENT:     Head: Normocephalic and atraumatic.  Cardiovascular:     Rate and Rhythm: Normal rate and regular rhythm.  Pulmonary:     Effort: Pulmonary effort is normal. No respiratory distress.     Breath sounds: Normal breath sounds. No wheezing or rales.  Abdominal:     General: Bowel sounds are normal. There is no distension.     Palpations: Abdomen is soft.     Tenderness: There is no abdominal tenderness. There is no rebound.  Musculoskeletal:     Cervical back: Normal range of motion.  Skin:    General: Skin is warm and dry.  Neurological:     Mental Status: She is alert and oriented to person, place, and time.     Coordination: Coordination normal.     Vitals:   09/14/20 1400  BP: 122/82  Pulse: 64  Resp: 18  Temp: 98.4 F (36.9 C)  TempSrc: Oral  SpO2: 98%  Weight: 276 lb 3.2 oz (125.3 kg)  Height: 5\' 9"  (1.753 m)    This visit occurred during the SARS-CoV-2 public health emergency.  Safety protocols  were in place, including screening questions prior to the visit, additional usage of staff PPE, and extensive cleaning of exam room while observing appropriate contact time as indicated for disinfecting solutions.   Assessment & Plan:

## 2020-09-14 NOTE — Patient Instructions (Addendum)
We have sent in celexa to take 1 pill (10 mg) daily for 2-3 weeks. Then let us know how this is doing. If helping enough stay at that does. If not helping enough we will plan to go up to 20 mg (2 pills) daily. We have sent in enough on the prescription to take 2 pills daily.   We will get you in with a counselor to help with coping skills.  Health Maintenance, Female Adopting a healthy lifestyle and getting preventive care are important in promoting health and wellness. Ask your health care provider about:  The right schedule for you to have regular tests and exams.  Things you can do on your own to prevent diseases and keep yourself healthy. What should I know about diet, weight, and exercise? Eat a healthy diet  Eat a diet that includes plenty of vegetables, fruits, low-fat dairy products, and lean protein.  Do not eat a lot of foods that are high in solid fats, added sugars, or sodium.   Maintain a healthy weight Body mass index (BMI) is used to identify weight problems. It estimates body fat based on height and weight. Your health care provider can help determine your BMI and help you achieve or maintain a healthy weight. Get regular exercise Get regular exercise. This is one of the most important things you can do for your health. Most adults should:  Exercise for at least 150 minutes each week. The exercise should increase your heart rate and make you sweat (moderate-intensity exercise).  Do strengthening exercises at least twice a week. This is in addition to the moderate-intensity exercise.  Spend less time sitting. Even light physical activity can be beneficial. Watch cholesterol and blood lipids Have your blood tested for lipids and cholesterol at 23 years of age, then have this test every 5 years. Have your cholesterol levels checked more often if:  Your lipid or cholesterol levels are high.  You are older than 23 years of age.  You are at high risk for heart  disease. What should I know about cancer screening? Depending on your health history and family history, you may need to have cancer screening at various ages. This may include screening for:  Breast cancer.  Cervical cancer.  Colorectal cancer.  Skin cancer.  Lung cancer. What should I know about heart disease, diabetes, and high blood pressure? Blood pressure and heart disease  High blood pressure causes heart disease and increases the risk of stroke. This is more likely to develop in people who have high blood pressure readings, are of African descent, or are overweight.  Have your blood pressure checked: ? Every 3-5 years if you are 26-23 years of age. ? Every year if you are 22 years old or older. Diabetes Have regular diabetes screenings. This checks your fasting blood sugar level. Have the screening done:  Once every three years after age 53 if you are at a normal weight and have a low risk for diabetes.  More often and at a younger age if you are overweight or have a high risk for diabetes. What should I know about preventing infection? Hepatitis B If you have a higher risk for hepatitis B, you should be screened for this virus. Talk with your health care provider to find out if you are at risk for hepatitis B infection. Hepatitis C Testing is recommended for:  Everyone born from [birth year] through 1963.  Anyone with known risk factors for hepatitis C. Sexually transmitted infections (STIs)  Get screened for STIs, including gonorrhea and chlamydia, if: ? You are sexually active and are younger than 23 years of age. ? You are older than 23 years of age and your health care provider tells you that you are at risk for this type of infection. ? Your sexual activity has changed since you were last screened, and you are at increased risk for chlamydia or gonorrhea. Ask your health care provider if you are at risk.  Ask your health care provider about whether you are at high  risk for HIV. Your health care provider may recommend a prescription medicine to help prevent HIV infection. If you choose to take medicine to prevent HIV, you should first get tested for HIV. You should then be tested every 3 months for as long as you are taking the medicine. Pregnancy  If you are about to stop having your period (premenopausal) and you may become pregnant, seek counseling before you get pregnant.  Take 400 to 800 micrograms (mcg) of folic acid every day if you become pregnant.  Ask for birth control (contraception) if you want to prevent pregnancy. Osteoporosis and menopause Osteoporosis is a disease in which the bones lose minerals and strength with aging. This can result in bone fractures. If you are 61 years old or older, or if you are at risk for osteoporosis and fractures, ask your health care provider if you should:  Be screened for bone loss.  Take a calcium or vitamin D supplement to lower your risk of fractures.  Be given hormone replacement therapy (HRT) to treat symptoms of menopause. Follow these instructions at home: Lifestyle  Do not use any products that contain nicotine or tobacco, such as cigarettes, e-cigarettes, and chewing tobacco. If you need help quitting, ask your health care provider.  Do not use street drugs.  Do not share needles.  Ask your health care provider for help if you need support or information about quitting drugs. Alcohol use  Do not drink alcohol if: ? Your health care provider tells you not to drink. ? You are pregnant, may be pregnant, or are planning to become pregnant.  If you drink alcohol: ? Limit how much you use to 0-1 drink a day. ? Limit intake if you are breastfeeding.  Be aware of how much alcohol is in your drink. In the [place] one drink equals one 12 oz bottle of beer (355 mL), one 5 oz glass of wine (148 mL), or one 1 oz glass of hard liquor (44 mL). General instructions  Schedule regular health, dental,  and eye exams.  Stay current with your vaccines.  Tell your health care provider if: ? You often feel depressed. ? You have ever been abused or do not feel safe at home. Summary  Adopting a healthy lifestyle and getting preventive care are important in promoting health and wellness.  Follow your health care provider's instructions about healthy diet, exercising, and getting tested or screened for diseases.  Follow your health care provider's instructions on monitoring your cholesterol and blood pressure. This information is not intended to replace advice given to you by your health care provider. Make sure you discuss any questions you have with your health care provider. Document Revised: 03/24/2018 Document Reviewed: 03/24/2018 Elsevier Patient Education  2021 [employer].

## 2020-09-14 NOTE — Assessment & Plan Note (Signed)
Rx celexa 10 mg for 2-3 weeks then increase to 20 mg daily depending on symptoms.

## 2020-09-14 NOTE — Assessment & Plan Note (Signed)
Albuterol prn only. No recent flares or within the last year.

## 2020-09-14 NOTE — Assessment & Plan Note (Signed)
Weight up slightly with pandemic and she is planning to start exercise to help.

## 2020-09-17 LAB — COMPREHENSIVE METABOLIC PANEL
ALT: 24 U/L (ref 0–35)
AST: 21 U/L (ref 0–37)
Albumin: 4.1 g/dL (ref 3.5–5.2)
Alkaline Phosphatase: 48 U/L (ref 39–117)
BUN: 11 mg/dL (ref 6–23)
CO2: 27 mEq/L (ref 19–32)
Calcium: 9.4 mg/dL (ref 8.4–10.5)
Chloride: 100 mEq/L (ref 96–112)
Creatinine, Ser: 0.86 mg/dL (ref 0.40–1.20)
GFR: 95.35 mL/min (ref >60.00)
Glucose, Bld: 75 mg/dL (ref 70–99)
Potassium: 4.5 mEq/L (ref 3.5–5.1)
Sodium: 135 mEq/L (ref 135–145)
Total Bilirubin: 0.4 mg/dL (ref 0.2–1.2)
Total Protein: 7.2 g/dL (ref 6.0–8.3)

## 2020-09-17 LAB — LIPID PANEL
Cholesterol: 149 mg/dL (ref 0–200)
HDL: 36.2 mg/dL — ABNORMAL LOW (ref >39.00)
LDL Cholesterol: 74 mg/dL (ref 0–99)
NonHDL: 113.15
Total CHOL/HDL Ratio: 4
Triglycerides: 194 mg/dL — ABNORMAL HIGH (ref 0.0–149.0)
VLDL: 38.8 mg/dL (ref 0.0–40.0)

## 2020-09-20 NOTE — Progress Notes (Signed)
Does not need referral can just call to schedule.

## 2021-02-15 ENCOUNTER — Other Ambulatory Visit: Payer: Self-pay

## 2021-02-15 ENCOUNTER — Encounter (HOSPITAL_COMMUNITY): Payer: Self-pay

## 2021-02-15 ENCOUNTER — Ambulatory Visit (HOSPITAL_COMMUNITY)
Admission: RE | Admit: 2021-02-15 | Discharge: 2021-02-15 | Disposition: A | Discharge status: Home / Self Care | Payer: Medicaid Other | Source: Ambulatory Visit | Attending: Student | Admitting: Student

## 2021-02-15 VITALS — BP 111/73 | HR 85 | Temp 99.0°F | Resp 12

## 2021-02-15 DIAGNOSIS — N939 Abnormal uterine and vaginal bleeding, unspecified: Secondary | ICD-10-CM | POA: Insufficient documentation

## 2021-02-15 LAB — POC URINE PREG, ED: Preg Test, Ur: NEGATIVE

## 2021-02-15 NOTE — ED Triage Notes (Signed)
Pt states she had her period 10/11-10/15, pt states she took a plan b on 10/21 and began bleeding again on 10/27 and is still having menstrual bleeding with fatigue and nausea.

## 2021-02-15 NOTE — Discharge Instructions (Addendum)
-  Your pregnancy test was normal. Your periods should slowly return to normal. If they don't, you can follow-up with your primary care or OB/GYN. -We are testing for BV, yeast, gonorrhea, chlamydia, trichomonas.  We will call you if anything is positive in about 3 days.

## 2021-02-15 NOTE — ED Provider Notes (Signed)
[facility]    CSN: [id number] Arrival date & time: 02/15/21  1358      History   Chief Complaint Chief Complaint  Patient presents with   menstral problems    HPI Barbara Daniels is a 25 y.o. female presenting with irregular period since taking Plan B.  Medical history irregular periods.  States that she had her period from October 11 through 15, this was a normal period.  She then had unprotected intercourse and took a Plan B on 10/21.  She has now been on her period for about 1 week, from 10/27 until today.  This is getting lighter and she actually thought it had stopped this morning, but now she is spotting again.  States this feels like a normal period, with occasional abdominal and back cramping but none currently.  Occasional nausea but no vomiting.  She states that her period is typically 4 days long. She does not take anything for birth control.  Denies other symptoms like dysuria, hematuria, vaginal discharge.  HPI  Past Medical History:  Diagnosis Date   Asthma    History of chickenpox    Hyperlipidemia     Patient Active Problem List   Diagnosis Date Noted   GAD (generalized anxiety disorder) 10/30/2016   Routine adult health maintenance 06/27/2016   Mild intermittent asthma 06/27/2016   Morbid obesity (HCC) 06/27/2016    Past Surgical History:  Procedure Laterality Date   WISDOM TOOTH EXTRACTION      OB History   No obstetric history on file.      Home Medications    Prior to Admission medications   Medication Sig Start Date End Date Taking? Authorizing Provider  citalopram (CELEXA) 10 MG tablet Take 2 tablets (20 mg total) by mouth daily. 09/14/20   Myrlene Broker, MD    Family History Family History  Problem Relation Age of Onset   Diabetes Father    Hypertension Father    Diabetes Maternal Grandmother    Arthritis Paternal Grandmother    Heart failure Paternal Grandfather     Social History Social History   Tobacco Use    Smoking status: Never   Smokeless tobacco: Never  Substance Use Topics   Alcohol use: No   Drug use: No     Allergies   Patient has no known allergies.   Review of Systems Review of Systems  Genitourinary:  Positive for menstrual problem.    Physical Exam Triage Vital Signs ED Triage Vitals  Enc Vitals Group     BP 02/15/21 1417 111/73     Pulse Rate 02/15/21 1417 85     Resp 02/15/21 1417 12     Temp 02/15/21 1417 99 F (37.2 C)     Temp src --      SpO2 02/15/21 1417 97 %     Weight --      Height --      Head Circumference --      Peak Flow --      Pain Score 02/15/21 1419 0     Pain Loc --      Pain Edu? --      Excl. in GC? --    No data found.  Updated Vital Signs BP 111/73 (BP Location: Right Arm)   Pulse 85   Temp 99 F (37.2 C)   Resp 12   SpO2 97%   Visual Acuity Right Eye Distance:   Left Eye Distance:   Bilateral  Distance:    Right Eye Near:   Left Eye Near:    Bilateral Near:     Physical Exam Vitals reviewed.  Constitutional:      General: She is not in acute distress.    Appearance: Normal appearance. She is not ill-appearing.  HENT:     Head: Normocephalic and atraumatic.     Mouth/Throat:     Mouth: Mucous membranes are moist.     Comments: Moist mucous membranes Eyes:     Extraocular Movements: Extraocular movements intact.     Pupils: Pupils are equal, round, and reactive to light.  Cardiovascular:     Rate and Rhythm: Normal rate and regular rhythm.     Heart sounds: Normal heart sounds.  Pulmonary:     Effort: Pulmonary effort is normal.     Breath sounds: Normal breath sounds. No wheezing, rhonchi or rales.  Abdominal:     General: Bowel sounds are normal. There is no distension.     Palpations: Abdomen is soft. There is no mass.     Tenderness: There is no abdominal tenderness. There is no right CVA tenderness, left CVA tenderness, guarding or rebound.  Genitourinary:    Comments: deferred Skin:    General:  Skin is warm.     Capillary Refill: Capillary refill takes less than 2 seconds.     Comments: Good skin turgor  Neurological:     General: No focal deficit present.     Mental Status: She is alert and oriented to person, place, and time.  Psychiatric:        Mood and Affect: Mood normal.        Behavior: Behavior normal.     UC Treatments / Results  Labs (all labs ordered are listed, but only abnormal results are displayed) Labs Reviewed  POC URINE PREG, ED  CERVICOVAGINAL ANCILLARY ONLY    EKG   Radiology No results found.  Procedures Procedures (including critical care time)  Medications Ordered in UC Medications - No data to display  Initial Impression / Assessment and Plan / UC Course  I have reviewed the triage vital signs and the nursing notes.  Pertinent labs & imaging results that were available during my care of the patient were reviewed by me and considered in my medical decision making (see chart for details).     This patient is a very pleasant 25 y.o. year old female presenting with abnormal period after taking a Plan B on 10/21. Negative u-preg today. Reassurance provided. Denies vaginal symptoms but will check a self-swab for G/C, trich, yeast, BV testing. Declines HIV, RPR. Safe sex precautions. ED return precautions discussed. Patient verbalizes understanding and agreement.     Final Clinical Impressions(s) / UC Diagnoses   Final diagnoses:  Abnormal uterine bleeding (AUB)     Discharge Instructions      -Your pregnancy test was normal. Your periods should slowly return to normal. If they don't, you can follow-up with your primary care or OB/GYN. -We are testing for BV, yeast, gonorrhea, chlamydia, trichomonas.  We will call you if anything is positive in about 3 days.   ED Prescriptions   None    PDMP not reviewed this encounter.   Hazel Sams, PA-C 02/15/21 1534

## 2021-02-18 LAB — CERVICOVAGINAL ANCILLARY ONLY
Bacterial Vaginitis (gardnerella): POSITIVE — AB
Candida Glabrata: NEGATIVE
Candida Vaginitis: NEGATIVE
Chlamydia: NEGATIVE
Comment: NEGATIVE
Comment: NEGATIVE
Comment: NEGATIVE
Comment: NEGATIVE
Comment: NEGATIVE
Comment: NORMAL
Neisseria Gonorrhea: NEGATIVE
Trichomonas: POSITIVE — AB

## 2021-02-19 ENCOUNTER — Telehealth (HOSPITAL_COMMUNITY): Payer: Self-pay | Admitting: Emergency Medicine

## 2021-02-19 MED ORDER — METRONIDAZOLE 500 MG PO TABS: 500.0000 mg | ORAL_TABLET | Freq: Two times a day (BID) | ORAL | 0 refills | Status: DC

## 2021-03-09 ENCOUNTER — Other Ambulatory Visit: Payer: Self-pay

## 2021-03-09 ENCOUNTER — Emergency Department (HOSPITAL_COMMUNITY)
Admission: EM | Admit: 2021-03-09 | Discharge: 2021-03-09 | Disposition: A | Discharge status: Home / Self Care | Payer: Medicaid Other | Attending: Emergency Medicine | Admitting: Emergency Medicine

## 2021-03-09 ENCOUNTER — Encounter (HOSPITAL_COMMUNITY): Payer: Self-pay | Admitting: Emergency Medicine

## 2021-03-09 DIAGNOSIS — J4520 Mild intermittent asthma, uncomplicated: Secondary | ICD-10-CM | POA: Diagnosis not present

## 2021-03-09 DIAGNOSIS — N898 Other specified noninflammatory disorders of vagina: Secondary | ICD-10-CM

## 2021-03-09 DIAGNOSIS — N3001 Acute cystitis with hematuria: Secondary | ICD-10-CM | POA: Insufficient documentation

## 2021-03-09 HISTORY — DX: Depression, unspecified: F32.A

## 2021-03-09 LAB — I-STAT BETA HCG BLOOD, ED (MC, WL, AP ONLY): I-stat hCG, quantitative: <5 m[IU]/mL (ref <5)

## 2021-03-09 LAB — COMPREHENSIVE METABOLIC PANEL
ALT: 23 U/L (ref 0–44)
AST: 18 U/L (ref 15–41)
Albumin: 3.7 g/dL (ref 3.5–5.0)
Alkaline Phosphatase: 50 U/L (ref 38–126)
Anion gap: 7 (ref 5–15)
BUN: 9 mg/dL (ref 6–20)
CO2: 25 mmol/L (ref 22–32)
Calcium: 9.4 mg/dL (ref 8.9–10.3)
Chloride: 102 mmol/L (ref 98–111)
Creatinine, Ser: 0.82 mg/dL (ref 0.44–1.00)
GFR, Estimated: >60 mL/min (ref >60)
Glucose, Bld: 92 mg/dL (ref 70–99)
Potassium: 3.9 mmol/L (ref 3.5–5.1)
Sodium: 134 mmol/L — ABNORMAL LOW (ref 135–145)
Total Bilirubin: 0.7 mg/dL (ref 0.3–1.2)
Total Protein: 7 g/dL (ref 6.5–8.1)

## 2021-03-09 LAB — URINALYSIS, ROUTINE W REFLEX MICROSCOPIC
Bilirubin Urine: NEGATIVE
Glucose, UA: NEGATIVE mg/dL
Ketones, ur: NEGATIVE mg/dL
Nitrite: NEGATIVE
Protein, ur: 100 mg/dL — AB
RBC / HPF: >50 RBC/hpf — ABNORMAL HIGH (ref 0–5)
Specific Gravity, Urine: 1.017 (ref 1.005–1.030)
WBC, UA: >50 WBC/hpf — ABNORMAL HIGH (ref 0–5)
pH: 6 (ref 5.0–8.0)

## 2021-03-09 LAB — CBC WITH DIFFERENTIAL/PLATELET
Abs Immature Granulocytes: 0.03 10*3/uL (ref 0.00–0.07)
Basophils Absolute: 0 10*3/uL (ref 0.0–0.1)
Basophils Relative: 1 %
Eosinophils Absolute: 0.1 10*3/uL (ref 0.0–0.5)
Eosinophils Relative: 1 %
HCT: 42.1 % (ref 36.0–46.0)
Hemoglobin: 14.8 g/dL (ref 12.0–15.0)
Immature Granulocytes: 0 %
Lymphocytes Relative: 40 %
Lymphs Abs: 2.9 10*3/uL (ref 0.7–4.0)
MCH: 31.3 pg (ref 26.0–34.0)
MCHC: 35.2 g/dL (ref 30.0–36.0)
MCV: 89 fL (ref 80.0–100.0)
Monocytes Absolute: 0.6 10*3/uL (ref 0.1–1.0)
Monocytes Relative: 8 %
Neutro Abs: 3.7 10*3/uL (ref 1.7–7.7)
Neutrophils Relative %: 50 %
Platelets: 270 10*3/uL (ref 150–400)
RBC: 4.73 MIL/uL (ref 3.87–5.11)
RDW: 12.4 % (ref 11.5–15.5)
WBC: 7.3 10*3/uL (ref 4.0–10.5)
nRBC: 0 % (ref 0.0–0.2)

## 2021-03-09 LAB — WET PREP, GENITAL
Clue Cells Wet Prep HPF POC: NONE SEEN
Sperm: NONE SEEN
Trich, Wet Prep: NONE SEEN
WBC, Wet Prep HPF POC: <10 (ref <10)
Yeast Wet Prep HPF POC: NONE SEEN

## 2021-03-09 LAB — HIV ANTIBODY (ROUTINE TESTING W REFLEX): HIV Screen 4th Generation wRfx: NONREACTIVE

## 2021-03-09 MED ORDER — CEPHALEXIN 500 MG PO CAPS: 500.0000 mg | ORAL_CAPSULE | Freq: Four times a day (QID) | ORAL | 0 refills | Status: DC

## 2021-03-09 NOTE — ED Provider Notes (Signed)
Patient is a 25 year old female whose care was transferred to me at shift change from [facility].  Please see her note below for additional information.  In summary, patient presents to the emergency department due to pressure with urination as well as an increase in vaginal discharge over the past few days.  Recently treated for trichomonas and BV with Flagyl however notes missing 2 doses.  She has not been sexually active since.  Patient had a reassuring physical exam as well as a pelvic exam.  Per previous provider, patient had no cervical motion tenderness or adnexal masses or tenderness.  At shift change patient pending lab work as well as a pregnancy test.  This has resulted and is generally reassuring.  CBC without leukocytosis.  CMP with a sodium of 134.  I-STAT beta-hCG less than 5.  Wet prep is negative.    UA does appear infectious.  There is moderate hemoglobin, 100 protein, large leukocytes, greater than 50 red blood cells, greater than 50 white blood cells, rare bacteria.  Culture has been sent.  Previous provider sent a prescription for Keflex.  Feel the patient is stable for discharge at this time and she is agreeable.  Patient given a referral to a local OB/GYN.  Her questions were answered and she was amicable at the time of discharge. VSS at the time of discharge.    Lylia, Karn, PA-C 03/09/21 1651    Rolan Bucco, MD 03/09/21 2308

## 2021-03-09 NOTE — ED Provider Notes (Signed)
[place] [facility] DEPART[facility] Provider Note   CSN: [id number] Arrival date & time: 03/09/21  1131     History No chief complaint on file.   Barbara Daniels is a 25 y.o. female with a past medical history significant for asthma, depression, and hyperlipidemia who presents to the ED due to increased vaginal discharge over the past few days.  Patient also endorses vaginal spotting.  Patient was recently seen at urgent care on 11/4 due to irregular menstrual cycles after taking Plan B.  At that time, patient was tested for STIs and a wet prep was obtained which was positive for trichomonas and BV.  Patient states she took Flagyl however, missed 2 doses and notes she has been having increased vaginal discharge since.  She also endorses pressure with urination.  Denies dysuria. She has not had any intercourse since testing positive trichomonas and BV.  She is not currently under the care of an OB/GYN.  She endorses some intermittent lower abdominal cramping.  No fever or chills.  Denies back and flank pain.  No history of kidney stones.  No treatment prior to arrival.  No aggravating alleviating factors.  History obtained from patient and past medical records. No interpreter used during encounter.       Past Medical History:  Diagnosis Date   Asthma    Depression    History of chickenpox    Hyperlipidemia     Patient Active Problem List   Diagnosis Date Noted   GAD (generalized anxiety disorder) 10/30/2016   Routine adult health maintenance 06/27/2016   Mild intermittent asthma 06/27/2016   Morbid obesity ([place]) 06/27/2016    Past Surgical History:  Procedure Laterality Date   WISDOM TOOTH EXTRACTION       OB History   No obstetric history on file.     Family History  Problem Relation Age of Onset   Diabetes Father    Hypertension Father    Diabetes Maternal Grandmother    Arthritis Paternal Grandmother    Heart failure Paternal Grandfather      Social History   Tobacco Use   Smoking status: Never   Smokeless tobacco: Never  Substance Use Topics   Alcohol use: No   Drug use: No    Home Medications Prior to Admission medications   Medication Sig Start Date End Date Taking? Authorizing Provider  citalopram (CELEXA) 10 MG tablet Take 2 tablets (20 mg total) by mouth daily. 09/14/20   Hoyt Koch, MD  metroNIDAZOLE (FLAGYL) 500 MG tablet Take 1 tablet (500 mg total) by mouth 2 (two) times daily. 02/19/21   Lamptey, Myrene Galas, MD    Allergies    Patient has no known allergies.  Review of Systems   Review of Systems  Constitutional:  Negative for chills and fever.  Gastrointestinal:  Positive for abdominal pain. Negative for diarrhea, nausea and vomiting.  Genitourinary:  Positive for menstrual problem, pelvic pain and vaginal discharge. Negative for dysuria.  Musculoskeletal:  Negative for back pain.  All other systems reviewed and are negative.  Physical Exam Updated Vital Signs BP 118/78   Pulse 76   Temp 98.3 F (36.8 C) (Oral)   Resp 20   SpO2 99%   Physical Exam Vitals and nursing note reviewed.  Constitutional:      General: She is not in acute distress.    Appearance: She is not ill-appearing.  HENT:     Head: Normocephalic.  Eyes:  Pupils: Pupils are equal, round, and reactive to light.  Cardiovascular:     Rate and Rhythm: Normal rate and regular rhythm.     Pulses: Normal pulses.     Heart sounds: Normal heart sounds. No murmur heard.   No friction rub. No gallop.  Pulmonary:     Effort: Pulmonary effort is normal.     Breath sounds: Normal breath sounds.  Abdominal:     General: Abdomen is flat. There is no distension.     Palpations: Abdomen is soft.     Tenderness: There is no abdominal tenderness. There is no guarding or rebound.     Comments: Abdomen soft, nondistended, nontender to palpation in all quadrants without guarding or peritoneal signs. No rebound.    Genitourinary:    Comments: Pelvic exam performed with chaperone in room.  Mild amount of yellow discharge.  No CMT.  No adnexal masses or tenderness. Musculoskeletal:        General: Normal range of motion.     Cervical back: Neck supple.  Skin:    General: Skin is warm and dry.  Neurological:     General: No focal deficit present.     Mental Status: She is alert.  Psychiatric:        Mood and Affect: Mood normal.        Behavior: Behavior normal.    ED Results / Procedures / Treatments   Labs (all labs ordered are listed, but only abnormal results are displayed) Labs Reviewed  URINALYSIS, ROUTINE W REFLEX MICROSCOPIC - Abnormal; Notable for the following components:      Result Value   APPearance CLOUDY (*)    Hgb urine dipstick MODERATE (*)    Protein, ur 100 (*)    Leukocytes,Ua LARGE (*)    RBC / HPF >50 (*)    WBC, UA >50 (*)    Bacteria, UA RARE (*)    All other components within normal limits  WET PREP, GENITAL  URINE CULTURE  HIV ANTIBODY (ROUTINE TESTING W REFLEX)  RPR  CBC WITH DIFFERENTIAL/PLATELET  COMPREHENSIVE METABOLIC PANEL  I-STAT BETA HCG BLOOD, ED ([facility], [facility], [facility] ONLY)  GC/CHLAMYDIA PROBE AMP ([place]) NOT AT [facility]    EKG None  Radiology No results found.  Procedures Procedures   Medications Ordered in ED Medications - No data to display  ED Course  I have reviewed the triage vital signs and the nursing notes.  Pertinent labs & imaging results that were available during my care of the patient were reviewed by me and considered in my medical decision making (see chart for details).  Clinical Course as of 03/09/21 1443  Sat Mar 09, 2021  1317 Leukocytes,Ua(!): LARGE [CA]  1317 Bacteria, UA(!): RARE [CA]    Clinical Course User Index [CA] Mannie Stabile, PA-C   MDM Rules/Calculators/A&P                          25 year old female presents to the ED due to pressure with urination and increased vaginal discharge over the past few  days.  Patient recently treated for trichomonas and BV with Flagyl however, missed 2 doses.  Patient has not been sexually active since.  Upon arrival, vitals all within normal limits.  Patient no acute distress.  Reassuring physical exam.  Abdomen soft, nondistended, nontender.  Low suspicion for acute abdomen.  Pelvic exam performed with chaperone in room with mild amount of yellow discharge.  Wet  prep and gonorrhea/chlamydia cultures obtained.  No CMT or adnexal masses or tenderness.  Low suspicion for PID.  UA significant for moderate hematuria, large leukocytes and rare bacteria.  Very minimal squamous cells.  Low sufficient for contamination.  Will treat for UTI given pressure with urination.  Patient discharged with Keflex.  No CVA tenderness to suggest pyelonephritis.  Wet prep unremarkable.  HIV negative.  Patient handed off to [facility], PA-C at shift change pending labs and pregnancy test. If negative, patient may be discharged home with UTI treatment and OBGYN follow-up.  Final Clinical Impression(s) / ED Diagnoses Final diagnoses:  Vaginal discharge  Acute cystitis with hematuria    Rx / DC Orders ED Discharge Orders     None        Karie Kirks 03/09/21 1512    Lucrezia Starch, MD 03/10/21 [phone]

## 2021-03-09 NOTE — Discharge Instructions (Addendum)
It was pleasure taking care of you today.  As discussed, your wet prep was negative for BV, trichomonas, and yeast.  Your urine showed a possible UTI.  I am sending you home with antibiotics.  Take as prescribed and finish all antibiotics.  I have included the number of the OB/GYN.  Please call to schedule an appointment for further evaluation. Return to the ER for new or worsening symptoms.

## 2021-03-09 NOTE — ED Notes (Signed)
Pt verbalizes understanding of discharge instructions. Opportunity for questions and answers were provided. Pt discharged from the ED.   ?

## 2021-03-09 NOTE — ED Provider Notes (Signed)
Emergency Medicine Provider Triage Evaluation Note  Barbara Daniels , a 25 y.o. female  was evaluated in triage.  Pt complains of vaginal pain and abnormal uterine bleeding.  Patient was seen here on 02/15/2021 and diagnosed with trichomonas and BV.  She was sent home with a prescription for metronidazole which she completed most of the course except for 2 pills.  Since then, her symptoms have not really resolved.  She now reports increasing vaginal tenderness, spotting, thick mucousy discharge, pelvic pain and pressure particularly when urinating.  Review of Systems  Positive: Pelvic pain, vaginal bleeding, vaginal pain Negative: Fevers, abdominal pain, nausea vomiting diarrhea  Physical Exam  BP 106/75 (BP Location: Left Arm)   Pulse 97   Temp 97.9 F (36.6 C) (Oral)   Resp 17   SpO2 97%  Gen:   Awake, no distress   Resp:  Normal effort  MSK:   Moves extremities without difficulty  Other:    Medical Decision Making  Medically screening exam initiated at 11:52 AM.  Appropriate orders placed.  [name] was informed that the remainder of the evaluation will be completed by another provider, this initial triage assessment does not replace that evaluation, and the importance of remaining in the ED until their evaluation is complete.     Janell Quiet, [place] 03/09/21 1155    Terald Sleeper, MD 03/09/21 1218

## 2021-03-09 NOTE — ED Triage Notes (Signed)
Pt reports irregular vaginal bleeding /spotting for a few weeks after taking Plan B.  States she was seen and told she had an infection and bleeding stopped while she was taking pills but she dropped the pills outside and wasn't able to complete Rx.  States for the past week it feels like she has a UTI.  Reports pressure with urinating, hematuria, and "mucous" in urine.

## 2021-03-10 LAB — RPR: RPR Ser Ql: NONREACTIVE

## 2021-03-11 LAB — URINE CULTURE: Culture: 90000 — AB

## 2021-03-11 LAB — GC/CHLAMYDIA PROBE AMP (~~LOC~~) NOT AT ARMC
Chlamydia: NEGATIVE
Comment: NEGATIVE
Comment: NORMAL
Neisseria Gonorrhea: NEGATIVE

## 2021-03-12 ENCOUNTER — Ambulatory Visit: Payer: Medicaid Other | Admitting: Internal Medicine

## 2021-03-26 ENCOUNTER — Ambulatory Visit: Payer: Medicaid Other | Admitting: Internal Medicine

## 2021-04-01 ENCOUNTER — Ambulatory Visit (INDEPENDENT_AMBULATORY_CARE_PROVIDER_SITE_OTHER): Payer: Medicaid Other | Admitting: Obstetrics and Gynecology

## 2021-04-01 ENCOUNTER — Other Ambulatory Visit: Payer: Self-pay

## 2021-04-01 ENCOUNTER — Encounter: Payer: Self-pay | Admitting: Obstetrics and Gynecology

## 2021-04-01 VITALS — BP 134/81 | HR 74 | Ht 69.75 in | Wt 271.7 lb

## 2021-04-01 DIAGNOSIS — Z3687 Encounter for antenatal screening for uncertain dates: Secondary | ICD-10-CM

## 2021-04-01 DIAGNOSIS — R030 Elevated blood-pressure reading, without diagnosis of hypertension: Secondary | ICD-10-CM | POA: Insufficient documentation

## 2021-04-01 DIAGNOSIS — Z3200 Encounter for pregnancy test, result unknown: Secondary | ICD-10-CM

## 2021-04-01 DIAGNOSIS — Z3490 Encounter for supervision of normal pregnancy, unspecified, unspecified trimester: Secondary | ICD-10-CM

## 2021-04-01 DIAGNOSIS — Z3201 Encounter for pregnancy test, result positive: Secondary | ICD-10-CM | POA: Diagnosis not present

## 2021-04-01 LAB — POCT PREGNANCY, URINE: Preg Test, Ur: POSITIVE — AB

## 2021-04-01 MED ORDER — PRENATAL VITAMIN 27-0.8 MG PO TABS: 1.0000 | ORAL_TABLET | Freq: Every day | ORAL | 11 refills | Status: DC

## 2021-04-01 NOTE — Progress Notes (Signed)
Patient was assessed and managed by nursing staff during this encounter. I have reviewed the chart and agree with the documentation and plan. I have also made any necessary editorial changes.  Scotia Bing, MD 04/01/2021 2:50 PM

## 2021-04-01 NOTE — Progress Notes (Signed)
Here for nurse visit for pregnancy test. States took plan B around 02/05/21. Period 02/08/21  and lasted 2 weeks, states heavy , then moderate. States usual period is 5 days. States has intercourse 03/01/21. States period app marked period late starting 03/09/21. Will order dating [place] to verify dating. [place] scheduled for first available date.   Instructed patient to start prenatal care asap and PNV. List of providers placed in AVS. She would like to go here. Options of prenatal care models discussed. Interested in Mom/Baby Dyad if it is available based on EDD once US done. Or traditional if not. Dr. Vergie Living in to see patient as she has not been seen before by our providers. Terran Hollenkamp,RN

## 2021-04-01 NOTE — Patient Instructions (Signed)
Prenatal Care Providers           [facility]  [address] [phone]  [facility]   [address]  [phone]  [facility] @ [place]       [address] [phone]            [facility] @ [place]     [address] [phone]          [facility] @ [place]   [address] [phone]  [facility]  [address] [phone]     [facility]  [address]   [phone]     [facility]  Phone: [phone]  [facility]  Phone: [phone]  [facility] Phone: [phone]  [facility] Phone: [phone]  [facility] Phone: [phone]  [facility] Phone: [phone]  [facility]  Phone: [phone]  

## 2021-04-04 ENCOUNTER — Ambulatory Visit
Admission: RE | Admit: 2021-04-04 | Discharge: 2021-04-04 | Disposition: A | Discharge status: Home / Self Care | Payer: Medicaid Other | Source: Ambulatory Visit | Attending: Obstetrics and Gynecology | Admitting: Obstetrics and Gynecology

## 2021-04-04 ENCOUNTER — Other Ambulatory Visit: Payer: Self-pay

## 2021-04-04 ENCOUNTER — Telehealth: Payer: Self-pay | Admitting: Advanced Practice Midwife

## 2021-04-04 DIAGNOSIS — Z3687 Encounter for antenatal screening for uncertain dates: Secondary | ICD-10-CM

## 2021-04-04 NOTE — Telephone Encounter (Signed)
I called Barbara Daniels today at 12:45 PM and confirmed patient's identity using two patient identifiers. [place] results from earlier today were reviewed. Patient is not yet scheduled for new OB visit. First trimester warning signs reviewed. Patient voiced understanding and had no further questions. Sent patient a list of OB providers via MyChart message.   US OB LESS THAN 14 WEEKS WITH OB TRANSVAGINAL  Result Date: 04/04/2021 CLINICAL DATA:  First trimester pregnancy, uncertain LMP, dating EXAM: OBSTETRIC <14 WK [place] AND TRANSVAGINAL OB US TECHNIQUE: Both transabdominal and transvaginal ultrasound examinations were performed for complete evaluation of the gestation as well as the maternal uterus, adnexal regions, and pelvic cul-de-sac. Transvaginal technique was performed to assess early pregnancy. COMPARISON:  None FINDINGS: Intrauterine gestational sac: Present, single Yolk sac:  Present Embryo:  Present Cardiac Activity: Present Heart Rate: 127 bpm CRL:  7.2 mm   6 w   4 d                  [place] EDC: 11/24/2021 Subchorionic hemorrhage:  None visualized. Maternal uterus/adnexae: Uterus anteverted, otherwise unremarkable. RIGHT ovary normal size and morphology, 3.5 x 3.3 x 1.9 cm. LEFT ovary normal size and morphology, 2.3 x 1.3 x 2.4 cm. No free pelvic fluid or adnexal masses. IMPRESSION: Single live intrauterine gestation at 6 weeks 4 days EGA by crown-rump length. No acute abnormalities. Electronically Signed   By: Ulyses Southward M.D.   On: 04/04/2021 11:06    Thressa Sheller DNP, CNM  04/04/21  12:46 PM

## 2021-04-11 ENCOUNTER — Ambulatory Visit: Payer: Medicaid Other | Admitting: Internal Medicine

## 2021-04-14 NOTE — L&D Delivery Note (Signed)
   Delivery Note:   G1P0 at [redacted]w[redacted]d  Admitting diagnosis: Pregnancy [Z34.90]  First Stage:  Induction of labor:n/a  Onset of labor: SROM 11/02/21  Augmentation: Pitocin ROM: SROM @ 0130 Active labor onset: @0130  Analgesia /Anesthesia/Pain control intrapartum: Epidural   Second Stage:  Complete dilation at 11/02/2021  1831 Onset of pushing at 1831 FHR second stage Cat II moderate variability with early and variable decels with quick return to baseline.    CNM called to bedside. Patient complete at +3 station. With great maternal efforts fetal head crowned up to +4 station. FHR: 120's. Patient Pushing in lithotomy position with CNM and L&D staff support at bedside. Alex (significant other) and Patient's mom present for birth and supportive.   Delivery of a Live born female  Birth Weight: pending  APGAR: 8, 9  Newborn Delivery   Birth date/time: 11/02/2021 18:54:00 Delivery type: Vaginal, Spontaneous     Fetal head delivered in cephalic presentation,direct OA position and restituted to ROT position with ease. With maternal efforts, anterior fetal shoulder observed and remaninig fetal body delivered with ease. Vigorous crying infant placed immediately skin to skin.  Nuchal Cord: No    After 5 mins of life cord double clamped by CNM and cut by Mother of the patient. . Collection of cord blood for typing completed. Cord blood donation-None  Arterial cord blood sample-No    Third Stage:  With maternal pushing efforts and gentle cord traction Placenta delivered-Spontaneous  intact with 3 vessels . Uterine tone firm bleeding scant Uterotonics: IV pitocin bolus initiated  Placenta to L&D for disposal.  Bilateral Periurethral and a right Labial  laceration identified.  Episiotomy:None  Local analgesia: none   Repair: Left periurethral hemostatic and not repaired. Right periurethral repaired with a 4.0 monocryl in traditional fashion. Right labial repaired with a 3.0 monocryl in  traditional fashion.    Est. Blood Loss (mL):100.00   Complications: None   Mom to postpartum.  Baby baby boy "[place]"to Couplet care / Skin to Skin.  Delivery Report:  Review the Delivery Report for details.    Jazalyn Mondor (11/04/2021) Danella Deis, MSN, CNM  [facility]  @TODAY @ 7:31 PM

## 2021-04-18 ENCOUNTER — Encounter: Payer: Medicaid Other | Admitting: Obstetrics and Gynecology

## 2021-05-01 ENCOUNTER — Telehealth (INDEPENDENT_AMBULATORY_CARE_PROVIDER_SITE_OTHER): Payer: Medicaid Other

## 2021-05-01 DIAGNOSIS — Z3A00 Weeks of gestation of pregnancy not specified: Secondary | ICD-10-CM

## 2021-05-01 DIAGNOSIS — Z3480 Encounter for supervision of other normal pregnancy, unspecified trimester: Secondary | ICD-10-CM | POA: Insufficient documentation

## 2021-05-01 DIAGNOSIS — Z3400 Encounter for supervision of normal first pregnancy, unspecified trimester: Secondary | ICD-10-CM

## 2021-05-01 NOTE — Progress Notes (Signed)
New OB Intake  I connected with  Barbara Daniels on 05/01/21 at 11:15 AM EST by MyChart Video Visit and verified that I am speaking with the correct person using two identifiers. Nurse is located at [facility] and pt is located at home.  I discussed the limitations, risks, security and privacy concerns of performing an evaluation and management service by telephone and the availability of in person appointments. I also discussed with the patient that there may be a patient responsible charge related to this service. The patient expressed understanding and agreed to proceed.  I explained I am completing New OB Intake today. We discussed her EDD of 11/24/21 that is based on LMP of 02/17/21. Pt is G1/P0. I reviewed her allergies, medications, Medical/Surgical/OB history, and appropriate screenings. I informed her of [facility] services. Based on history, this is a/an  pregnancy uncomplicated .   Patient Active Problem List   Diagnosis Date Noted   Transient hypertension 04/01/2021   GAD (generalized anxiety disorder) 10/30/2016   Routine adult health maintenance 06/27/2016   Mild intermittent asthma 06/27/2016   Morbid obesity (HCC) 06/27/2016    Concerns addressed today  Delivery Plans:  Plans to deliver at [facility].   MyChart/Babyscripts MyChart access verified. I explained pt will have some visits in office and some virtually. Babyscripts instructions given and order placed. Patient verifies receipt of registration text/e-mail. Account successfully created and app downloaded.  Blood Pressure Cuff  Blood pressure cuff ordered for patient to pick-up from [employer]. Explained after first prenatal appt pt will check weekly and document in Babyscripts.  Weight scale: Patient does / does not  have weight scale. Weight scale ordered for patient to pick up from [employer].   Anatomy US Explained first scheduled [place] will be around 19 weeks. Anatomy US scheduled for 07/01/21 at 10:30a. Pt notified  to arrive at 10:15a.  Labs Discussed Avelina Laine genetic screening with patient. Would like both Panorama and Horizon drawn at new OB visit. Routine prenatal labs needed.  Covid Vaccine Patient has covid vaccine.   CenteringPregnancy Candidate?  If yes, offer as possibility  Mother/ Baby Dyad Candidate?Yes/Accepted    If yes, offer as possibility  Informed patient of [facility] website  and placed link in her AVS.   Social Determinants of Health Food Insecurity: Patient expresses food insecurity. Food Market information given to patient; explained patient may visit at the end of first OB appointment. WIC Referral: Patient is interested in referral to [facility].  Transportation: Patient expressed transportation needs. Transportation Services reviewed with patient; patient registered and phone number provided for patient to schedule rides. Childcare: Discussed no children allowed at ultrasound appointments. Offered childcare services; patient declines childcare services at this time.  Send link to Pregnancy Navigators   Placed OB Box on problem list and updated  First visit review I reviewed new OB appt with pt. I explained she will have a pelvic exam, ob bloodwork with genetic screening, and PAP smear. Explained pt will be seen by Dr. Crissie Reese at first visit; encounter routed to appropriate provider. Explained that patient will be seen by pregnancy navigator following visit with provider. [facility] information placed in AVS.   Henrietta Dine, CMA 05/01/2021  11:26 AM

## 2021-05-01 NOTE — Patient Instructions (Signed)
  At our [facility], we work as an integrated team, providing care to address both physical and emotional health. Your medical provider may refer you to see our Behavioral Health Clinician (BHC) on the same day you see your medical provider, as availability permits; often scheduled virtually at your convenience.  Our BHC is available to all patients, visits generally last between 20-30 minutes, but can be longer or shorter, depending on patient need. The BHC offers help with stress management, coping with symptoms of depression and anxiety, major life changes , sleep issues, changing risky behavior, grief and loss, life stress, working on personal life goals, and  behavioral health issues, as these all affect your overall health and wellness.  The BHC is NOT available for the following: FMLA paperwork, court-ordered evaluations, specialty assessments (custody or disability), letters to employers, or obtaining certification for an emotional support animal. The BHC does not provide long-term therapy. You have the right to refuse integrated behavioral health services, or to reschedule to see the BHC at a later date.  Confidentiality exception: If it is suspected that a child or disabled adult is being abused or neglected, we are required by law to report that to either Child Protective Services or Adult Protective Services.  If you have a diagnosis of Bipolar affective disorder, Schizophrenia, or recurrent Major depressive disorder, we will recommend that you establish care with a psychiatrist, as these are lifelong, chronic conditions, and we want your overall emotional health and medications to be more closely monitored. If you anticipate needing extended maternity leave due to mental health issues postpartum, it it recommended you inform your medical provider, so we can put in a referral to a psychiatrist as soon as possible. The BHC is unable to recommend an extended maternity leave for mental  health issues. Your medical provider or BHC may refer you to a therapist for ongoing, traditional therapy, or to a psychiatrist, for medication management, if it would benefit your overall health. Depending on your insurance, you may have a copay or be charged a deductible, depending on your insurance, to see the BHC. If you are uninsured, it is recommended that you apply for financial assistance. (Forms may be requested at the front desk for in-person visits, via MyChart, or request a form during a virtual visit).  If you see the BHC more than 6 times, you will have to complete a comprehensive clinical assessment interview with the BHC to resume integrated services.  For virtual visits with the BHC, you must be physically in the state of [place] at the time of the visit. For example, if you live in [place], you will have to do an in-person visit with the BHC, and your out-of-state insurance may not cover behavioral health services in [place]. If you are going out of the state or country for any reason, the BHC may see you virtually when you return to [place], but not while you are physically outside of [place].    

## 2021-05-05 ENCOUNTER — Encounter: Payer: Self-pay | Admitting: Family Medicine

## 2021-05-05 NOTE — Progress Notes (Unsigned)
Received call from [employer].  BP of 138/98. Given GA, will monitor. Reva Bores 05/05/2021 1:51 PM

## 2021-05-08 ENCOUNTER — Encounter: Payer: Medicaid Other | Admitting: Certified Nurse Midwife

## 2021-05-09 ENCOUNTER — Telehealth: Payer: Self-pay | Admitting: Family Medicine

## 2021-05-09 MED ORDER — ONDANSETRON 8 MG PO TBDP: 8.0000 mg | ORAL_TABLET | Freq: Three times a day (TID) | ORAL | 0 refills | Status: DC | PRN

## 2021-05-09 NOTE — Telephone Encounter (Signed)
Patient said for the past 24 hours she have been having Vomiting and diarrhea, not sure what to do

## 2021-05-09 NOTE — Telephone Encounter (Signed)
Call received from patient. Spoke with pt. Pt states having nausea, vomiting and diarrhea for last 24 hours. Pt states is unable to eat or drink or keep anything down at this time. Reviewed call with Dr Shawnie Pons. Pt advised that this is probably viral and to keep hydrated with Gatorade, Pedialyte, etc.  Ok to send in Zofran ODT Rx. Rx sent to pharmacy.  Pt verbalized understanding and agreeable to plan of care.   Judeth Cornfield, RN

## 2021-06-05 ENCOUNTER — Other Ambulatory Visit (HOSPITAL_COMMUNITY)
Admission: RE | Admit: 2021-06-05 | Discharge: 2021-06-05 | Disposition: A | Discharge status: Home / Self Care | Payer: Medicaid Other | Source: Ambulatory Visit | Attending: Certified Nurse Midwife | Admitting: Certified Nurse Midwife

## 2021-06-05 ENCOUNTER — Ambulatory Visit (INDEPENDENT_AMBULATORY_CARE_PROVIDER_SITE_OTHER): Payer: Medicaid Other | Admitting: Family Medicine

## 2021-06-05 ENCOUNTER — Other Ambulatory Visit: Payer: Self-pay

## 2021-06-05 VITALS — BP 137/89 | HR 114 | Wt 271.2 lb

## 2021-06-05 DIAGNOSIS — J4520 Mild intermittent asthma, uncomplicated: Secondary | ICD-10-CM

## 2021-06-05 DIAGNOSIS — Z124 Encounter for screening for malignant neoplasm of cervix: Secondary | ICD-10-CM | POA: Insufficient documentation

## 2021-06-05 DIAGNOSIS — F411 Generalized anxiety disorder: Secondary | ICD-10-CM

## 2021-06-05 DIAGNOSIS — R030 Elevated blood-pressure reading, without diagnosis of hypertension: Secondary | ICD-10-CM

## 2021-06-05 DIAGNOSIS — Z3480 Encounter for supervision of other normal pregnancy, unspecified trimester: Secondary | ICD-10-CM

## 2021-06-05 DIAGNOSIS — O99212 Obesity complicating pregnancy, second trimester: Secondary | ICD-10-CM

## 2021-06-05 DIAGNOSIS — Z5941 Food insecurity: Secondary | ICD-10-CM

## 2021-06-05 NOTE — Progress Notes (Signed)
Subjective:   Barbara Daniels is a 26 y.o. G1P0 at [redacted]w[redacted]d by early ultrasound being seen today for her first obstetrical visit.  Her obstetrical history is significant for obesity and asthma . Patient does intend to breast feed. Pregnancy history fully reviewed.  Patient reports no complaints.  HISTORY: OB History  Gravida Para Term Preterm AB Living  1 0 0 0 0 0  SAB IAB Ectopic Multiple Live Births  0 0 0 0 0    # Outcome Date GA Lbr Len/2nd Weight Sex Delivery Anes PTL Lv  1 Current              Last pap smear: No results found for: DIAGPAP, HPV, HPVHIGH *needs*  Past Medical History:  Diagnosis Date   Anxiety    Asthma    Depression    History of chickenpox    Hyperlipidemia    Past Surgical History:  Procedure Laterality Date   WISDOM TOOTH EXTRACTION     Family History  Problem Relation Age of Onset   Diabetes Father    Hypertension Father    Diabetes Maternal Grandmother    Arthritis Paternal Grandmother    Heart failure Paternal Grandfather    Social History   Tobacco Use   Smoking status: Never   Smokeless tobacco: Never  Substance Use Topics   Alcohol use: No   Drug use: No   Allergies  Allergen Reactions   Penicillins Hives   Current Outpatient Medications on File Prior to Visit  Medication Sig Dispense Refill   ondansetron (ZOFRAN-ODT) 8 MG disintegrating tablet Take 1 tablet (8 mg total) by mouth every 8 (eight) hours as needed for nausea or vomiting. 20 tablet 0   Prenatal Vit-Fe Fumarate-FA (PRENATAL VITAMIN) 27-0.8 MG TABS Take 1 tablet by mouth daily. 30 tablet 11   No current facility-administered medications on file prior to visit.     Exam   Vitals:   06/05/21 1345  BP: 137/89  Pulse: (!) 114  Weight: 271 lb 3.2 oz (123 kg)   Fetal Heart Rate (bpm): 145  Uterus:     Pelvic Exam: Perineum: no hemorrhoids, normal perineum   Vulva: normal external genitalia, no lesions   Vagina:  normal mucosa mild amount of white  discharge but denies any symptoms   Cervix: no lesions and normal, pap smear done.   System: General: well-developed, well-nourished female in no acute distress   Skin: normal coloration and turgor, no rashes   Neurologic: oriented, normal, negative, normal mood   Extremities: normal strength, tone, and muscle mass, ROM of all joints is normal   HEENT PERRLA, extraocular movement intact and sclera clear, anicteric   Neck supple and no masses   Respiratory:  no respiratory distress      Assessment:   Pregnancy: G1P0 Patient Active Problem List   Diagnosis Date Noted   Supervision of other normal pregnancy, antepartum 05/01/2021   Transient hypertension 04/01/2021   GAD (generalized anxiety disorder) 10/30/2016   Routine adult health maintenance 06/27/2016   Mild intermittent asthma 06/27/2016   Obesity affecting pregnancy 06/27/2016     Plan:  1. Supervision of other normal pregnancy, antepartum BP and FHR normal Initial labs drawn. Continue prenatal vitamins. Genetic Screening discussed, NIPS: ordered. Ultrasound discussed; fetal anatomic survey: ordered. Problem list reviewed and updated. The nature of Dyad/Family Care clinic was explained to patient; Voiced they may need to be seen by other [facility] providers which includes family medicine physicians, OB  GYNs, and APPs. Delivery will hopefully be with one of the Dyad providers or another [facility] Medicine physician and we cannot promise this at this time.  Discussed there are [facility] staff in the hospital 24-7 and they understand and support this model and there is a likelihood one of these providers will catch their baby.  We also discussed that the service includes learners (residents, student) and they will be involved in the care team.   2. Screening for cervical cancer Pap collected today  3. Transient hypertension Noted in chart, unclear why it was added Epic chart reviewed extensively, over the past five years she has had a  single elevated BP in the ED, all outpatient visits she has been normotensive Does not meet criteria for cHTN diagnosis  4. Mild intermittent asthma without complication Exercise induced, not a problem of late  5. Obesity affecting pregnancy in second trimester   6. GAD (generalized anxiety disorder) Per chart review last saw PCP 09/14/2020 and was rx'd lexapro Reports she is doing well at present Not taking lexapro consistently, plans to stop Interested in counseling, referred to [facility]   Routine obstetric precautions reviewed. Return in 4 weeks (on 07/03/2021) for Dyad patient, ob visit.

## 2021-06-05 NOTE — Patient Instructions (Signed)
Second Trimester of Pregnancy °The second trimester of pregnancy is from week 13 through week 27. This is months 4 through 6 of pregnancy. The second trimester is often a time when you feel your best. Your body has adjusted to being pregnant, and you begin to feel better physically. °During the second trimester: °Morning sickness has lessened or stopped completely. °You may have more energy. °You may have an increase in appetite. °The second trimester is also a time when the unborn baby (fetus) is growing rapidly. At the end of the sixth month, the fetus may be up to 12 inches long and weigh about 1½ pounds. You will likely begin to feel the baby move (quickening) between 16 and 20 weeks of pregnancy. °Body changes during your second trimester °Your body continues to go through many changes during your second trimester. The changes vary and generally return to normal after the baby is born. °Physical changes °Your weight will continue to increase. You will notice your lower abdomen bulging out. °You may begin to get stretch marks on your hips, abdomen, and breasts. °Your breasts will continue to grow and to become tender. °Dark spots or blotches (chloasma or mask of pregnancy) may develop on your face. °A dark line from your belly button to the pubic area (linea nigra) may appear. °You may have changes in your hair. These can include thickening of your hair, rapid growth, and changes in texture. Some people also have hair loss during or after pregnancy, or hair that feels dry or thin. °Health changes °You may develop headaches. °You may have heartburn. °You may develop constipation. °You may develop hemorrhoids or swollen, bulging veins (varicose veins). °Your gums may bleed and may be sensitive to brushing and flossing. °You may urinate more often because the fetus is pressing on your bladder. °You may have back pain. This is caused by: °Weight gain. °Pregnancy hormones that are relaxing the joints in your  pelvis. °A shift in weight and the muscles that support your balance. °Follow these instructions at home: °Medicines °Follow your health care provider's instructions regarding medicine use. Specific medicines may be either safe or unsafe to take during pregnancy. Do not take any medicines unless approved by your health care provider. °Take a prenatal vitamin that contains at least 600 micrograms (mcg) of folic acid. °Eating and drinking °Eat a healthy diet that includes fresh fruits and vegetables, whole grains, good sources of protein such as meat, eggs, or tofu, and low-fat dairy products. °Avoid raw meat and unpasteurized juice, milk, and cheese. These carry germs that can harm you and your baby. °You may need to take these actions to prevent or treat constipation: °Drink enough fluid to keep your urine pale yellow. °Eat foods that are high in fiber, such as beans, whole grains, and fresh fruits and vegetables. °Limit foods that are high in fat and processed sugars, such as fried or sweet foods. °Activity °Exercise only as directed by your health care provider. Most people can continue their usual exercise routine during pregnancy. Try to exercise for 30 minutes at least 5 days a week. Stop exercising if you develop contractions in your uterus. °Stop exercising if you develop pain or cramping in the lower abdomen or lower back. °Avoid exercising if it is very hot or humid or if you are at a high altitude. °Avoid heavy lifting. °If you choose to, you may have sex unless your health care provider tells you not to. °Relieving pain and discomfort °Wear a supportive bra   to prevent discomfort from breast tenderness. °Take warm sitz baths to soothe any pain or discomfort caused by hemorrhoids. Use hemorrhoid cream if your health care provider approves. °Rest with your legs raised (elevated) if you have leg cramps or low back pain. °If you develop varicose veins: °Wear support hose as told by your health care  provider. °Elevate your feet for 15 minutes, 3-4 times a day. °Limit salt in your diet. °Safety °Wear your seat belt at all times when driving or riding in a car. °Talk with your health care provider if someone is verbally or physically abusive to you. °Lifestyle °Do not use hot tubs, steam rooms, or saunas. °Do not douche. Do not use tampons or scented sanitary pads. °Avoid cat litter boxes and soil used by cats. These carry germs that can cause birth defects in the baby and possibly loss of the fetus by miscarriage or stillbirth. °Do not use herbal remedies, alcohol, illegal drugs, or medicines that are not approved by your health care provider. Chemicals in these products can harm your baby. °Do not use any products that contain nicotine or tobacco, such as cigarettes, e-cigarettes, and chewing tobacco. If you need help quitting, ask your health care provider. °General instructions °During a routine prenatal visit, your health care provider will do a physical exam and other tests. He or she will also discuss your overall health. Keep all follow-up visits. This is important. °Ask your health care provider for a referral to a local prenatal education class. °Ask for help if you have counseling or nutritional needs during pregnancy. Your health care provider can offer advice or refer you to specialists for help with various needs. °Where to find more information °American Pregnancy Association: americanpregnancy.org °American College of Obstetricians and Gynecologists: acog.org/en/Womens%20Health/Pregnancy °Office on Women's Health: womenshealth.gov/pregnancy °Contact a health care provider if you have: °A headache that does not go away when you take medicine. °Vision changes or you see spots in front of your eyes. °Mild pelvic cramps, pelvic pressure, or nagging pain in the abdominal area. °Persistent nausea, vomiting, or diarrhea. °A bad-smelling vaginal discharge or foul-smelling urine. °Pain when you  urinate. °Sudden or extreme swelling of your face, hands, ankles, feet, or legs. °A fever. °Get help right away if you: °Have fluid leaking from your vagina. °Have spotting or bleeding from your vagina. °Have severe abdominal cramping or pain. °Have difficulty breathing. °Have chest pain. °Have fainting spells. °Have not felt your baby move for the time period told by your health care provider. °Have new or increased pain, swelling, or redness in an arm or leg. °Summary °The second trimester of pregnancy is from week 13 through week 27 (months 4 through 6). °Do not use herbal remedies, alcohol, illegal drugs, or medicines that are not approved by your health care provider. Chemicals in these products can harm your baby. °Exercise only as directed by your health care provider. Most people can continue their usual exercise routine during pregnancy. °Keep all follow-up visits. This is important. °This information is not intended to replace advice given to you by your health care provider. Make sure you discuss any questions you have with your health care provider. °Document Revised: 09/07/2019 Document Reviewed: 07/14/2019 °Elsevier Patient Education © 2022 [employer] ° °Contraception Choices °Contraception, also called birth control, refers to methods or devices that prevent pregnancy. °Hormonal methods °Contraceptive implant °A contraceptive implant is a thin, plastic tube that contains a hormone that prevents pregnancy. It is different from an intrauterine device (IUD). It   is inserted into the upper part of the arm by a health care provider. Implants can be effective for up to 3 years. °Progestin-only injections °Progestin-only injections are injections of progestin, a synthetic form of the hormone progesterone. They are given every 3 months by a health care provider. °Birth control pills °Birth control pills are pills that contain hormones that prevent pregnancy. They must be taken once a day, preferably at the  same time each day. A prescription is needed to use this method of contraception. °Birth control patch °The birth control patch contains hormones that prevent pregnancy. It is placed on the skin and must be changed once a week for three weeks and removed on the fourth week. A prescription is needed to use this method of contraception. °Vaginal ring °A vaginal ring contains hormones that prevent pregnancy. It is placed in the vagina for three weeks and removed on the fourth week. After that, the process is repeated with a new ring. A prescription is needed to use this method of contraception. °Emergency contraceptive °Emergency contraceptives prevent pregnancy after unprotected sex. They come in pill form and can be taken up to 5 days after sex. They work best the sooner they are taken after having sex. Most emergency contraceptives are available without a prescription. This method should not be used as your only form of birth control. °Barrier methods °Female condom °A female condom is a thin sheath that is worn over the penis during sex. Condoms keep sperm from going inside a woman's body. They can be used with a sperm-killing substance (spermicide) to increase their effectiveness. They should be thrown away after one use. °Female condom °A female condom is a soft, loose-fitting sheath that is put into the vagina before sex. The condom keeps sperm from going inside a woman's body. They should be thrown away after one use. °Diaphragm °A diaphragm is a soft, dome-shaped barrier. It is inserted into the vagina before sex, along with a spermicide. The diaphragm blocks sperm from entering the uterus, and the spermicide kills sperm. A diaphragm should be left in the vagina for 6-8 hours after sex and removed within 24 hours. °A diaphragm is prescribed and fitted by a health care provider. A diaphragm should be replaced every 1-2 years, after giving birth, after gaining more than 15 lb (6.8 kg), and after pelvic  surgery. °Cervical cap °A cervical cap is a round, soft latex or plastic cup that fits over the cervix. It is inserted into the vagina before sex, along with spermicide. It blocks sperm from entering the uterus. The cap should be left in place for 6-8 hours after sex and removed within 48 hours. A cervical cap must be prescribed and fitted by a health care provider. It should be replaced every 2 years. °Sponge °A sponge is a soft, circular piece of polyurethane foam with spermicide in it. The sponge helps block sperm from entering the uterus, and the spermicide kills sperm. To use it, you make it wet and then insert it into the vagina. It should be inserted before sex, left in for at least 6 hours after sex, and removed and thrown away within 30 hours. °Spermicides °Spermicides are chemicals that kill or block sperm from entering the cervix and uterus. They can come as a cream, jelly, suppository, foam, or tablet. A spermicide should be inserted into the vagina with an applicator at least 10-15 minutes before sex to allow time for it to work. The process must be repeated every time   you have sex. Spermicides do not require a prescription. °Intrauterine contraception °Intrauterine device (IUD) °An IUD is a T-shaped device that is put in a woman's uterus. There are two types: °Hormone IUD.This type contains progestin, a synthetic form of the hormone progesterone. This type can stay in place for 3-5 years. °Copper IUD.This type is wrapped in copper wire. It can stay in place for 10 years. °Permanent methods of contraception °Female tubal ligation °In this method, a woman's fallopian tubes are sealed, tied, or blocked during surgery to prevent eggs from traveling to the uterus. °Hysteroscopic sterilization °In this method, a small, flexible insert is placed into each fallopian tube. The inserts cause scar tissue to form in the fallopian tubes and block them, so sperm cannot reach an egg. The procedure takes about 3  months to be effective. Another form of birth control must be used during those 3 months. °Female sterilization °This is a procedure to tie off the tubes that carry sperm (vasectomy). After the procedure, the man can still ejaculate fluid (semen). Another form of birth control must be used for 3 months after the procedure. °Natural planning methods °Natural family planning °In this method, a couple does not have sex on days when the woman could become pregnant. °Calendar method °In this method, the woman keeps track of the length of each menstrual cycle, identifies the days when pregnancy can happen, and does not have sex on those days. °Ovulation method °In this method, a couple avoids sex during ovulation. °Symptothermal method °This method involves not having sex during ovulation. The woman typically checks for ovulation by watching changes in her temperature and in the consistency of cervical mucus. °Post-ovulation method °In this method, a couple waits to have sex until after ovulation. °Where to find more information °Centers for Disease Control and Prevention: www.cdc.gov °Summary °Contraception, also called birth control, refers to methods or devices that prevent pregnancy. °Hormonal methods of contraception include implants, injections, pills, patches, vaginal rings, and emergency contraceptives. °Barrier methods of contraception can include female condoms, female condoms, diaphragms, cervical caps, sponges, and spermicides. °There are two types of IUDs (intrauterine devices). An IUD can be put in a woman's uterus to prevent pregnancy for 3-5 years. °Permanent sterilization can be done through a procedure for males and females. Natural family planning methods involve nothaving sex on days when the woman could become pregnant. °This information is not intended to replace advice given to you by your health care provider. Make sure you discuss any questions you have with your health care provider. °Document  Revised: 09/05/2019 Document Reviewed: 09/05/2019 °Elsevier Patient Education © 2022 Elsevier Inc. ° °

## 2021-06-07 ENCOUNTER — Encounter: Payer: Medicaid Other | Admitting: Obstetrics and Gynecology

## 2021-06-07 ENCOUNTER — Encounter: Payer: Self-pay | Admitting: *Deleted

## 2021-06-07 LAB — AFP, SERUM, OPEN SPINA BIFIDA
AFP MoM: 1.07
AFP Value: 26.7 ng/mL
Gest. Age on Collection Date: 15.3 weeks
Maternal Age At EDD: 24.4 yr
OSBR Risk 1 IN: 10000
Test Results:: NEGATIVE
Weight: 271 [lb_av]

## 2021-06-07 LAB — CBC/D/PLT+RPR+RH+ABO+RUBIGG...
Antibody Screen: NEGATIVE
Basophils Absolute: 0 10*3/uL (ref 0.0–0.2)
Basos: 0 %
EOS (ABSOLUTE): 0.1 10*3/uL (ref 0.0–0.4)
Eos: 1 %
HCV Ab: NONREACTIVE
HIV Screen 4th Generation wRfx: NONREACTIVE
Hematocrit: 39.6 % (ref 34.0–46.6)
Hemoglobin: 13.6 g/dL (ref 11.1–15.9)
Hepatitis B Surface Ag: NEGATIVE
Immature Grans (Abs): 0 10*3/uL (ref 0.0–0.1)
Immature Granulocytes: 0 %
Lymphocytes Absolute: 2.4 10*3/uL (ref 0.7–3.1)
Lymphs: 35 %
MCH: 30.7 pg (ref 26.6–33.0)
MCHC: 34.3 g/dL (ref 31.5–35.7)
MCV: 89 fL (ref 79–97)
Monocytes Absolute: 0.5 10*3/uL (ref 0.1–0.9)
Monocytes: 8 %
Neutrophils Absolute: 3.7 10*3/uL (ref 1.4–7.0)
Neutrophils: 56 %
Platelets: 272 10*3/uL (ref 150–450)
RBC: 4.43 x10E6/uL (ref 3.77–5.28)
RDW: 12.7 % (ref 11.7–15.4)
RPR Ser Ql: NONREACTIVE
Rh Factor: POSITIVE
Rubella Antibodies, IGG: 2.8 index (ref >0.99)
WBC: 6.8 10*3/uL (ref 3.4–10.8)

## 2021-06-07 LAB — HEMOGLOBIN A1C
Est. average glucose Bld gHb Est-mCnc: 105 mg/dL
Hgb A1c MFr Bld: 5.3 % (ref 4.8–5.6)

## 2021-06-07 LAB — CULTURE, OB URINE

## 2021-06-07 LAB — URINE CULTURE, OB REFLEX: Organism ID, Bacteria: NO GROWTH

## 2021-06-07 LAB — HCV INTERPRETATION

## 2021-06-11 ENCOUNTER — Telehealth: Payer: Self-pay

## 2021-06-11 LAB — CYTOLOGY - PAP
Chlamydia: NEGATIVE
Comment: NEGATIVE
Comment: NEGATIVE
Comment: NEGATIVE
Comment: NORMAL
Diagnosis: NEGATIVE
High risk HPV: NEGATIVE
Neisseria Gonorrhea: NEGATIVE
Trichomonas: NEGATIVE

## 2021-06-11 NOTE — Telephone Encounter (Signed)
Called nurse phone to request results. Explained we will call patient to review results later today.

## 2021-06-11 NOTE — Telephone Encounter (Signed)
Returned patients call. She did not answer. LM for her to call the office at her convenience and to check her My Chart message.

## 2021-06-13 ENCOUNTER — Inpatient Hospital Stay (HOSPITAL_COMMUNITY)
Admission: AD | Admit: 2021-06-13 | Discharge: 2021-06-13 | Disposition: A | Discharge status: Home / Self Care | Payer: Medicaid Other | Attending: Obstetrics & Gynecology | Admitting: Obstetrics & Gynecology

## 2021-06-13 ENCOUNTER — Other Ambulatory Visit: Payer: Self-pay

## 2021-06-13 ENCOUNTER — Inpatient Hospital Stay (HOSPITAL_COMMUNITY): Payer: Medicaid Other

## 2021-06-13 DIAGNOSIS — O26892 Other specified pregnancy related conditions, second trimester: Secondary | ICD-10-CM | POA: Diagnosis present

## 2021-06-13 DIAGNOSIS — M25562 Pain in left knee: Secondary | ICD-10-CM | POA: Insufficient documentation

## 2021-06-13 DIAGNOSIS — W010XXA Fall on same level from slipping, tripping and stumbling without subsequent striking against object, initial encounter: Secondary | ICD-10-CM | POA: Diagnosis not present

## 2021-06-13 DIAGNOSIS — W19XXXA Unspecified fall, initial encounter: Secondary | ICD-10-CM

## 2021-06-13 DIAGNOSIS — M25569 Pain in unspecified knee: Secondary | ICD-10-CM

## 2021-06-13 DIAGNOSIS — Z3A16 16 weeks gestation of pregnancy: Secondary | ICD-10-CM | POA: Diagnosis not present

## 2021-06-13 LAB — URINALYSIS, ROUTINE W REFLEX MICROSCOPIC
Bilirubin Urine: NEGATIVE
Glucose, UA: NEGATIVE mg/dL
Hgb urine dipstick: NEGATIVE
Ketones, ur: 5 mg/dL — AB
Leukocytes,Ua: NEGATIVE
Nitrite: NEGATIVE
Protein, ur: NEGATIVE mg/dL
Specific Gravity, Urine: 1.024 (ref 1.005–1.030)
pH: 6 (ref 5.0–8.0)

## 2021-06-13 NOTE — Progress Notes (Signed)
Written and verbal d/c instructions given and understanding voiced. 

## 2021-06-13 NOTE — Discharge Instructions (Signed)

## 2021-06-13 NOTE — MAU Note (Addendum)
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 Pt slipped and fell to L side and heard "2pops" in L knee. Went to ED for knee pain but when they found out she was pregnant was sent over to MAU and did not check out knee Pt did not hit abdomen but fell on L side. Only pain is in L knee. Reports some spotting 2 days ago but none after the fall. (Unable to sign waiver due to computer not working ) ?

## 2021-06-13 NOTE — MAU Provider Note (Addendum)
?History  ?  ? ?CSN: [id number] ? ?Arrival date and time: 06/13/21 1801 ? ? ?Chief Complaint  ?Patient presents with  ? Fall  ? ?Barbara Daniels is a 26 yo female G1P0 at [redacted]w[redacted]d presenting for evaluation of acute left knee pain after a fall.  She reports this occurred around 430 this afternoon while chasing preschoolers on the playground.  She slipped in some water and then landed on the lateral portion of her left knee, heard 2 audible pops and immediate discomfort in her knee afterwards.  She was able to get up and walk since that time with a small limp, however wanted to be evaluated to make sure everything was okay.  States it hurts most whenever she tries to fully extend her left knee or press on the left side.  She does not feel like her knee is bigger compared to the right.  Denies any ecchymoses, erythema, continued popping, giving out sensation, weakness/numbness, or locking. Did not land on abdomen.  ? ?She also reports 2 days ago whenever she wiped she had a small amount of pink discharge.  She has had no further similar discharge since that episode.  Denies any vaginal bleeding onto her underwear.  Has not been sexually active since pregnancy start.  Denies any change in vaginal discharge, itching, or irritation. ? ?Past Medical History:  ?Diagnosis Date  ? Anxiety   ? Asthma   ? Depression   ? History of chickenpox   ? Hyperlipidemia   ? ? ?Past Surgical History:  ?Procedure Laterality Date  ? WISDOM TOOTH EXTRACTION    ? ? ?Family History  ?Problem Relation Age of Onset  ? Diabetes Father   ? Hypertension Father   ? Diabetes Maternal Grandmother   ? Arthritis Paternal Grandmother   ? Heart failure Paternal Grandfather   ? ? ?Social History  ? ?Tobacco Use  ? Smoking status: Never  ? Smokeless tobacco: Never  ?Substance Use Topics  ? Alcohol use: No  ? Drug use: No  ? ? ?Allergies:  ?Allergies  ?Allergen Reactions  ? Penicillins Hives  ? ? ?Medications Prior to Admission  ?Medication Sig Dispense Refill Last Dose   ? ondansetron (ZOFRAN-ODT) 8 MG disintegrating tablet Take 1 tablet (8 mg total) by mouth every 8 (eight) hours as needed for nausea or vomiting. 20 tablet 0   ? Prenatal Vit-Fe Fumarate-FA (PRENATAL VITAMIN) 27-0.8 MG TABS Take 1 tablet by mouth daily. 30 tablet 11   ? ? ?Review of Systems  ?Constitutional:  Negative for fever.  ?Respiratory:  Negative for chest tightness and shortness of breath.   ?Gastrointestinal:  Negative for abdominal pain, constipation, diarrhea, nausea and vomiting.  ?Genitourinary:  Negative for dysuria.  ?Musculoskeletal:  Positive for arthralgias, gait problem and myalgias. Negative for joint swelling.  ?Skin:  Negative for pallor and wound.  ?Neurological:  Negative for dizziness, weakness, light-headedness and numbness.  ?Psychiatric/Behavioral:  Negative for behavioral problems.   ?Physical Exam  ? ?Blood pressure 129/76, pulse 69, temperature 98 ?F (36.7 ?C), resp. rate 17, last menstrual period 09/06/2020, SpO2 100 %. ? ?Physical Exam ?Constitutional:   ?   General: She is not in acute distress. ?   Appearance: Normal appearance. She is not diaphoretic.  ?HENT:  ?   Head: Normocephalic and atraumatic.  ?   Mouth/Throat:  ?   Mouth: Mucous membranes are moist.  ?Eyes:  ?   Extraocular Movements: Extraocular movements intact.  ?Cardiovascular:  ?   Pulses: Normal pulses.  ?  Pulmonary:  ?   Effort: Pulmonary effort is normal.  ?Musculoskeletal:  ?   Right lower leg: No edema.  ?   Left lower leg: No edema.  ?   Comments: Left Knee in comparison to R: ?- Inspection: No gross deformity. No appreciable swelling/effusion on palpation and knee appearance same to the right, erythema or bruising. Skin intact.  ?- Palpation: TTP over lateral joint line with some nonspecific tenderness around the fibular head and surrounding lateral area. ?- ROM: full active ROM with flexion and extension in knee with some discomfort on full extension of the left ?- Strength: 5/5 strength ?- Neuro/vasc: NV  intact ?- Special Tests: ?- LIGAMENTS: negative anterior and posterior drawer, no MCL laxity, possible LCL laxity ?-- MENISCUS: Equivocal [place]ss[place] (some non-specific pain in the L knee)  ?-- PF JOINT: negative patellar grind, negative patellar apprehension ? ?Able to place complete pressure on the left leg while walking, small limp at the end of gait.  ?Skin: ?   General: Skin is warm and dry.  ?   Capillary Refill: Capillary refill takes less than 2 seconds.  ?Neurological:  ?   Mental Status: She is alert and oriented to person, place, and time.  ?Psychiatric:     ?   Behavior: Behavior normal.  ? ?Fetal heart tone 137 bpm ? ?MAU Course  ?DG Knee Complete 4 Views Left ? ?Result Date: 06/13/2021 ?CLINICAL DATA:  Larey Seat EXAM: LEFT KNEE - COMPLETE 4+ VIEW COMPARISON:  None. FINDINGS: No evidence of fracture, dislocation, or joint effusion. No evidence of arthropathy or other focal bone abnormality. Soft tissues are unremarkable. IMPRESSION: Negative. Electronically Signed   By: Tish Frederickson M.D.   On: 06/13/2021 21:42    ? ?MDM ?Obtain left knee x-rays given more specific tenderness over the fibular head, however overall low suspicion for fracture.  Some possible laxity with LCL testing on the left, however will be quite rare to have an isolated LCL tear, especially with this relatively benign mechanism.  ? ?Assessment and Plan  ? ?1. Acute left lateral knee pain without effusion ?2. Fall, initial encounter ?X-rays as above, currently pending.  Suspect likely contusion, however if not improving/worsening could certainly consider ligamentous/meniscal injury.  Encouraged RICE and Tylenol. ? ?3. [redacted] weeks gestation of pregnancy ?+ Fetal heart tones.  Provided reassurance. ? ?Care signed out to [facility], CNM, while awaiting XR results. Plan for likely discharge home and outpatient follow up.  ?  ?Allayne Stack ?06/13/2021, 9:08 PM  ? ?Negative X-ray of knee. Discussed supportive care at length and note given for  activity restrictions at work.  ? ?1. Acute knee pain   ?2. Fall, initial encounter   ?3. [redacted] weeks gestation of pregnancy   ? ?-Discharge home in stable condition ?-Second trimester precautions discussed ?-Patient advised to follow-up with OB as scheduled for prenatal care ?-Patient may return to MAU as needed or if her condition were to change or worsen ? ?Rolm Bookbinder, CNM ?06/14/21 ?12:12 AM ? ?

## 2021-06-13 NOTE — MAU Provider Note (Incomplete)
°  History     CSN: [id number]  Arrival date and time: 06/13/21 1801   Event Date/Time   First Provider Initiated Contact with Patient 06/13/21 2148      Chief Complaint  Patient presents with   Fall   HPI  {GYN/OB [id number]}  Past Medical History:  Diagnosis Date   Anxiety    Asthma    Depression    History of chickenpox    Hyperlipidemia     Past Surgical History:  Procedure Laterality Date   WISDOM TOOTH EXTRACTION      Family History  Problem Relation Age of Onset   Diabetes Father    Hypertension Father    Diabetes Maternal Grandmother    Arthritis Paternal Grandmother    Heart failure Paternal Grandfather     Social History   Tobacco Use   Smoking status: Never   Smokeless tobacco: Never  Substance Use Topics   Alcohol use: No   Drug use: No    Allergies:  Allergies  Allergen Reactions   Penicillins Hives    Medications Prior to Admission  Medication Sig Dispense Refill Last Dose   ondansetron (ZOFRAN-ODT) 8 MG disintegrating tablet Take 1 tablet (8 mg total) by mouth every 8 (eight) hours as needed for nausea or vomiting. 20 tablet 0    Prenatal Vit-Fe Fumarate-FA (PRENATAL VITAMIN) 27-0.8 MG TABS Take 1 tablet by mouth daily. 30 tablet 11     Review of Systems Physical Exam   Blood pressure 129/76, pulse 69, temperature 98 F (36.7 C), resp. rate 17, last menstrual period 09/06/2020, SpO2 100 %.  Physical Exam  MAU Course  Procedures  MDM ***  Assessment and Plan  ***  Barbara Daniels 06/13/2021, 9:48 PM

## 2021-06-21 ENCOUNTER — Other Ambulatory Visit: Payer: Self-pay | Admitting: Family Medicine

## 2021-07-01 ENCOUNTER — Ambulatory Visit: Payer: Medicaid Other | Attending: Family Medicine

## 2021-07-01 ENCOUNTER — Other Ambulatory Visit: Payer: Self-pay | Admitting: *Deleted

## 2021-07-01 ENCOUNTER — Other Ambulatory Visit: Payer: Self-pay

## 2021-07-01 ENCOUNTER — Ambulatory Visit: Payer: Medicaid Other | Admitting: *Deleted

## 2021-07-01 ENCOUNTER — Encounter: Payer: Self-pay | Admitting: *Deleted

## 2021-07-01 VITALS — BP 130/75 | HR 74

## 2021-07-01 DIAGNOSIS — O99512 Diseases of the respiratory system complicating pregnancy, second trimester: Secondary | ICD-10-CM | POA: Diagnosis not present

## 2021-07-01 DIAGNOSIS — O321XX0 Maternal care for breech presentation, not applicable or unspecified: Secondary | ICD-10-CM | POA: Diagnosis not present

## 2021-07-01 DIAGNOSIS — Z363 Encounter for antenatal screening for malformations: Secondary | ICD-10-CM | POA: Diagnosis present

## 2021-07-01 DIAGNOSIS — Z3689 Encounter for other specified antenatal screening: Secondary | ICD-10-CM

## 2021-07-01 DIAGNOSIS — J45909 Unspecified asthma, uncomplicated: Secondary | ICD-10-CM | POA: Diagnosis not present

## 2021-07-01 DIAGNOSIS — Z3A19 19 weeks gestation of pregnancy: Secondary | ICD-10-CM | POA: Diagnosis not present

## 2021-07-01 DIAGNOSIS — Z362 Encounter for other antenatal screening follow-up: Secondary | ICD-10-CM

## 2021-07-01 DIAGNOSIS — O99212 Obesity complicating pregnancy, second trimester: Secondary | ICD-10-CM | POA: Diagnosis not present

## 2021-07-01 DIAGNOSIS — Z6841 Body Mass Index (BMI) 40.0 and over, adult: Secondary | ICD-10-CM

## 2021-07-01 DIAGNOSIS — Z3480 Encounter for supervision of other normal pregnancy, unspecified trimester: Secondary | ICD-10-CM | POA: Insufficient documentation

## 2021-07-10 ENCOUNTER — Other Ambulatory Visit (HOSPITAL_COMMUNITY)
Admission: RE | Admit: 2021-07-10 | Discharge: 2021-07-10 | Disposition: A | Discharge status: Home / Self Care | Payer: Medicaid Other | Source: Ambulatory Visit | Attending: Family Medicine | Admitting: Family Medicine

## 2021-07-10 ENCOUNTER — Ambulatory Visit (INDEPENDENT_AMBULATORY_CARE_PROVIDER_SITE_OTHER): Payer: Medicaid Other | Admitting: Family Medicine

## 2021-07-10 ENCOUNTER — Encounter: Payer: Self-pay | Admitting: Family Medicine

## 2021-07-10 VITALS — BP 127/87 | HR 78 | Wt 274.4 lb

## 2021-07-10 DIAGNOSIS — F411 Generalized anxiety disorder: Secondary | ICD-10-CM

## 2021-07-10 DIAGNOSIS — N898 Other specified noninflammatory disorders of vagina: Secondary | ICD-10-CM | POA: Insufficient documentation

## 2021-07-10 DIAGNOSIS — Z3480 Encounter for supervision of other normal pregnancy, unspecified trimester: Secondary | ICD-10-CM

## 2021-07-10 DIAGNOSIS — O99212 Obesity complicating pregnancy, second trimester: Secondary | ICD-10-CM

## 2021-07-10 MED ORDER — FLUCONAZOLE 150 MG PO TABS
150.0000 mg | ORAL_TABLET | Freq: Once | ORAL | 0 refills | Status: AC
Start: 2021-07-10 — End: 2021-07-10

## 2021-07-10 NOTE — Progress Notes (Signed)
? ?  Subjective:  ?Barbara Daniels is a 26 y.o. G1P0 at [redacted]w[redacted]d being seen today for ongoing prenatal care.  She is currently monitored for the following issues for this low-risk pregnancy and has Routine adult health maintenance; Mild intermittent asthma; Obesity affecting pregnancy; GAD (generalized anxiety disorder); and Supervision of other normal pregnancy, antepartum on their problem list. ? ?Patient reports no complaints.  Contractions: Not present. Vag. Bleeding: None.  Movement: Present. Denies leaking of fluid.  ? ?The following portions of the patient's history were reviewed and updated as appropriate: allergies, current medications, past family history, past medical history, past social history, past surgical history and problem list. Problem list updated. ? ?Objective:  ? ?Vitals:  ? 07/10/21 1419  ?BP: 127/87  ?Pulse: 78  ?Weight: 274 lb 6.4 oz (124.5 kg)  ? ? ?Fetal Status: Fetal Heart Rate (bpm): 140   Movement: Present    ? ?General:  Alert, oriented and cooperative. Patient is in no acute distress.  ?Skin: Skin is warm and dry. No rash noted.   ?Cardiovascular: Normal heart rate noted  ?Respiratory: Normal respiratory effort, no problems with respiration noted  ?Abdomen: Soft, gravid, appropriate for gestational age. Pain/Pressure: Absent     ?Pelvic: Vag. Bleeding: None     ?Cervical exam deferred        ?Extremities: Normal range of motion.     ?Mental Status: Normal mood and affect. Normal behavior. Normal judgment and thought content.  ? ?Urinalysis:     ? ?Assessment and Plan:  ?Pregnancy: G1P0 at [redacted]w[redacted]d ? ?1. Supervision of other normal pregnancy, antepartum ?BP and FHR normal ?Up to date on labs ?Endorsing some vaginal itching suspicious for yeast vaginitis, self swab obtained and will message results through mychart ? ?2. GAD (generalized anxiety disorder) ?Patient's mood is up and down ?Mother was recently raped and almost killed by abusive partner ?After her father's death she had  relationships with three men, and now does not know who the father is and none of them are interested in finding out or are actively denying the baby is theirs ?In addition she has a brother who just went to prison for the next 5-7 years and another who got out and has been causing problems in the family ?Many, many stressors in her life and obviously all of this is weighing on her mind with birth of her son coming up in the next few months ?Accepts referral to [facility] ?Will see her back in 2 weeks to see how she is doing mood wise ? ?3. Obesity affecting pregnancy in second trimester ? ? ?Preterm labor symptoms and general obstetric precautions including but not limited to vaginal bleeding, contractions, leaking of fluid and fetal movement were reviewed in detail with the patient. ?Please refer to After Visit Summary for other counseling recommendations.  ?Return in 4 weeks (on 08/07/2021). ? ? ?Venora Maples, MD ? ?

## 2021-07-10 NOTE — Patient Instructions (Addendum)
Contraception Choices ?Contraception, also called birth control, refers to methods or devices that prevent pregnancy. ?Hormonal methods ?Contraceptive implant ?A contraceptive implant is a thin, plastic tube that contains a hormone that prevents pregnancy. It is different from an intrauterine device (IUD). It is inserted into the upper part of the arm by a health care provider. Implants can be effective for up to 3 years. ?Progestin-only injections ?Progestin-only injections are injections of progestin, a synthetic form of the hormone progesterone. They are given every 3 months by a health care provider. ?Birth control pills ?Birth control pills are pills that contain hormones that prevent pregnancy. They must be taken once a day, preferably at the same time each day. A prescription is needed to use this method of contraception. ?Birth control patch ?The birth control patch contains hormones that prevent pregnancy. It is placed on the skin and must be changed once a week for three weeks and removed on the fourth week. A prescription is needed to use this method of contraception. ?Vaginal ring ?A vaginal ring contains hormones that prevent pregnancy. It is placed in the vagina for three weeks and removed on the fourth week. After that, the process is repeated with a new ring. A prescription is needed to use this method of contraception. ?Emergency contraceptive ?Emergency contraceptives prevent pregnancy after unprotected sex. They come in pill form and can be taken up to 5 days after sex. They work best the sooner they are taken after having sex. Most emergency contraceptives are available without a prescription. This method should not be used as your only form of birth control. ?Barrier methods ?Female condom ?A female condom is a thin sheath that is worn over the penis during sex. Condoms keep sperm from going inside a woman's body. They can be used with a sperm-killing substance (spermicide) to increase their  effectiveness. They should be thrown away after one use. ?Female condom ?A female condom is a soft, loose-fitting sheath that is put into the vagina before sex. The condom keeps sperm from going inside a woman's body. They should be thrown away after one use. ?Diaphragm ?A diaphragm is a soft, dome-shaped barrier. It is inserted into the vagina before sex, along with a spermicide. The diaphragm blocks sperm from entering the uterus, and the spermicide kills sperm. A diaphragm should be left in the vagina for 6-8 hours after sex and removed within 24 hours. ?A diaphragm is prescribed and fitted by a health care provider. A diaphragm should be replaced every 1-2 years, after giving birth, after gaining more than 15 lb (6.8 kg), and after pelvic surgery. ?Cervical cap ?A cervical cap is a round, soft latex or plastic cup that fits over the cervix. It is inserted into the vagina before sex, along with spermicide. It blocks sperm from entering the uterus. The cap should be left in place for 6-8 hours after sex and removed within 48 hours. A cervical cap must be prescribed and fitted by a health care provider. It should be replaced every 2 years. ?Sponge ?A sponge is a soft, circular piece of polyurethane foam with spermicide in it. The sponge helps block sperm from entering the uterus, and the spermicide kills sperm. To use it, you make it wet and then insert it into the vagina. It should be inserted before sex, left in for at least 6 hours after sex, and removed and thrown away within 30 hours. ?Spermicides ?Spermicides are chemicals that kill or block sperm from entering the cervix and uterus.  They can come as a cream, jelly, suppository, foam, or tablet. A spermicide should be inserted into the vagina with an applicator at least 10-15 minutes before sex to allow time for it to work. The process must be repeated every time you have sex. Spermicides do not require a prescription. ?Intrauterine  contraception ?Intrauterine device (IUD) ?An IUD is a T-shaped device that is put in a woman's uterus. There are two types: ?Hormone IUD.This type contains progestin, a synthetic form of the hormone progesterone. This type can stay in place for 3-5 years. ?Copper IUD.This type is wrapped in copper wire. It can stay in place for 10 years. ?Permanent methods of contraception ?Female tubal ligation ?In this method, a woman's fallopian tubes are sealed, tied, or blocked during surgery to prevent eggs from traveling to the uterus. ?Hysteroscopic sterilization ?In this method, a small, flexible insert is placed into each fallopian tube. The inserts cause scar tissue to form in the fallopian tubes and block them, so sperm cannot reach an egg. The procedure takes about 3 months to be effective. Another form of birth control must be used during those 3 months. ?Female sterilization ?This is a procedure to tie off the tubes that carry sperm (vasectomy). After the procedure, the man can still ejaculate fluid (semen). Another form of birth control must be used for 3 months after the procedure. ?Natural planning methods ?Natural family planning ?In this method, a couple does not have sex on days when the woman could become pregnant. ?Calendar method ?In this method, the woman keeps track of the length of each menstrual cycle, identifies the days when pregnancy can happen, and does not have sex on those days. ?Ovulation method ?In this method, a couple avoids sex during ovulation. ?Symptothermal method ?This method involves not having sex during ovulation. The woman typically checks for ovulation by watching changes in her temperature and in the consistency of cervical mucus. ?Post-ovulation method ?In this method, a couple waits to have sex until after ovulation. ?Where to find more information ?Centers for Disease Control and Prevention: FootballExhibition.com.br ?Summary ?Contraception, also called birth control, refers to methods or devices  that prevent pregnancy. ?Hormonal methods of contraception include implants, injections, pills, patches, vaginal rings, and emergency contraceptives. ?Barrier methods of contraception can include female condoms, female condoms, diaphragms, cervical caps, sponges, and spermicides. ?There are two types of IUDs (intrauterine devices). An IUD can be put in a woman's uterus to prevent pregnancy for 3-5 years. ?Permanent sterilization can be done through a procedure for males and females. Natural family planning methods involve nothaving sex on days when the woman could become pregnant. ?This information is not intended to replace advice given to you by your health care provider. Make sure you discuss any questions you have with your health care provider. ?Document Revised: 09/05/2019 Document Reviewed: 09/05/2019 ?Elsevier Patient Education ? 2022 Elsevier Inc. ?w ?

## 2021-07-11 LAB — CERVICOVAGINAL ANCILLARY ONLY
Bacterial Vaginitis (gardnerella): NEGATIVE
Candida Glabrata: NEGATIVE
Candida Vaginitis: POSITIVE — AB
Chlamydia: NEGATIVE
Comment: NEGATIVE
Comment: NEGATIVE
Comment: NEGATIVE
Comment: NEGATIVE
Comment: NEGATIVE
Comment: NORMAL
Neisseria Gonorrhea: NEGATIVE
Trichomonas: NEGATIVE

## 2021-07-11 MED ORDER — FLUCONAZOLE 150 MG PO TABS: 150.0000 mg | ORAL_TABLET | Freq: Once | ORAL | 0 refills | Status: AC

## 2021-07-11 NOTE — Addendum Note (Signed)
Addended by: Merian Capron on: 07/11/2021 05:06 PM ? ? Modules accepted: Orders ? ?

## 2021-07-12 NOTE — BH Specialist Note (Signed)
Integrated Behavioral Health via Telemedicine Visit ? ?07/22/2021 ?[name] ?[id number] ? ?Number of Integrated Behavioral Health Clinician visits: 1- Initial Visit ? ?Session Start time: 1317 ?  ?Session End time: 1411 ? ?Total time in minutes: 54 ? ? ?Referring Provider: Merian Capron, MD ?Patient/Family location: Home ?[facility] Provider location: [facility] [employer] [facility] ? ?All persons participating in visit: Patient Barbara Daniels and Barbara Daniels  ? ?Types of Service: Individual psychotherapy and Video visit ? ?I connected with Barbara Daniels and/or Barbara Daniels's  n/a  via  Telephone or Engineer, civil (consulting)  (Video is Caregility application) and verified that I am speaking with the correct person using two identifiers. Discussed confidentiality: Yes  ? ?I discussed the limitations of telemedicine and the availability of in person appointments.  Discussed there is a possibility of technology failure and discussed alternative modes of communication if that failure occurs. ? ?I discussed that engaging in this telemedicine visit, they consent to the provision of behavioral healthcare and the services will be billed under their insurance. ? ?Patient and/or legal guardian expressed understanding and consented to Telemedicine visit: Yes  ? 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  ?Duration of problem: Ongoing with increase in current pregnancy; Severity of problem: moderate ? ?Patient and/or Family's Strengths/Protective Factors: ?Sense of purpose ? ?Goals Addressed: ?Patient will: ? Reduce  symptoms of: anxiety, depression, and stress  ? Increase knowledge and/or ability of: stress reduction  ? Demonstrate ability to: Increase healthy adjustment to current life circumstances ? ?Progress towards Goals: ?Ongoing ? ?Interventions: ?Interventions utilized:  Psychoeducation and/or Health Education, Link to [employer], and Communication Skills ?Standardized Assessments completed: Not Needed ? ?Patient and/or Family Response: Patient agrees with treatment plan. ? ? ?Assessment: ?Patient currently experiencing Adjustment disorder with mixed anxious and depressed mood and Grief and Psychosocial stress.  ? ?Patient may benefit from psychoeducation and brief therapeutic interventions regarding coping with symptoms of anxiety, depression, life stress ?. ? ?Plan: ?Follow up with behavioral health clinician on : Two weeks ?Behavioral recommendations:  ?-Continue taking prenatal vitamins as prescribed ?-Continue plan to attend waterbirth class ?-Practice setting "small fences"(boundaries) with others, as discussed, for the next two weeks. Remember, it's okay to say "no" to something you don't want to do.  ?-Consider group grief support resource (on After Visit Summary) as needed, for yourself and family ?Referral(s): Integrated Art gallery manager (In Clinic) and [employer]:  grief support ? ?I discussed the assessment and treatment plan with the patient and/or parent/guardian. They were provided an opportunity to ask questions and all were answered. They agreed with the plan and demonstrated an understanding of the instructions. ?  ?They were advised to call back or seek an in-person evaluation if the symptoms worsen or if the condition fails to improve as anticipated. ? ?Rae Lips, LCSW ? ? ?  06/05/2021  ?  1:43 PM 09/14/2020  ?  2:19 PM 04/02/2017  ? 10:40 AM  ?Depression screen PHQ 2/9  ?Decreased Interest 1 1 1   ?Down, Depressed, Hopeless 1 1 3   ?PHQ - 2 Score 2 2 4   ?Altered  sleeping 0 2 3  ?  Tired, decreased energy 1 1 1   ?Change in appetite 0 2 0  ?Feeling bad or failure about yourself  0 1 1  ?Trouble concentrating 1 2 2   ?Moving slowly or fidgety/restless 1 0 0  ?Suicidal thoughts 0 0 1  ?PHQ-9 Score 5 10 12   ?Difficult doing work/chores Not difficult at all    ? ? ?  06/05/2021  ?  1:43 PM 09/14/2020  ?  2:18 PM 04/02/2017  ? 10:40 AM  ?GAD 7 : Generalized Anxiety Score  ?Nervous, Anxious, on Edge 2 2 2   ?Control/stop worrying 1 2 3   ?Worry too much - different things 1 2 3   ?Trouble relaxing 1 1 2   ?Restless 1 1 1   ?Easily annoyed or irritable 1 3 3   ?Afraid - awful might happen 0 3 1  ?Total GAD 7 Score 7 14 15   ?Anxiety Difficulty Not difficult at all    ? ? ? ?

## 2021-07-22 ENCOUNTER — Ambulatory Visit (INDEPENDENT_AMBULATORY_CARE_PROVIDER_SITE_OTHER): Payer: Medicaid Other | Admitting: Clinical

## 2021-07-22 DIAGNOSIS — Z658 Other specified problems related to psychosocial circumstances: Secondary | ICD-10-CM

## 2021-07-22 DIAGNOSIS — F4323 Adjustment disorder with mixed anxiety and depressed mood: Secondary | ICD-10-CM | POA: Diagnosis not present

## 2021-07-22 DIAGNOSIS — F4321 Adjustment disorder with depressed mood: Secondary | ICD-10-CM

## 2021-07-22 NOTE — Patient Instructions (Signed)
[facility] [address] [place], [place] [address] [phone] (main office) [phone] (Jhoan Schmieder's office)  [email or web address]  [facility] (Individual and group grief support) [facility]  1-800-588-8879      

## 2021-07-23 NOTE — BH Specialist Note (Signed)
Pt did not arrive to video visit and did not answer the phone; Left HIPPA-compliant message to call back Rishi Vicario from [facility] at  [phone] (Kloee Ballew's office).  ?; left MyChart message for patient.  ? ?

## 2021-07-26 ENCOUNTER — Encounter: Payer: Self-pay | Admitting: Family Medicine

## 2021-07-26 ENCOUNTER — Ambulatory Visit (INDEPENDENT_AMBULATORY_CARE_PROVIDER_SITE_OTHER): Payer: Medicaid Other | Admitting: Family Medicine

## 2021-07-26 VITALS — BP 111/66 | HR 87 | Wt 280.9 lb

## 2021-07-26 DIAGNOSIS — J4520 Mild intermittent asthma, uncomplicated: Secondary | ICD-10-CM

## 2021-07-26 DIAGNOSIS — Z3480 Encounter for supervision of other normal pregnancy, unspecified trimester: Secondary | ICD-10-CM

## 2021-07-26 DIAGNOSIS — O99212 Obesity complicating pregnancy, second trimester: Secondary | ICD-10-CM

## 2021-07-26 DIAGNOSIS — F411 Generalized anxiety disorder: Secondary | ICD-10-CM

## 2021-07-26 NOTE — Patient Instructions (Signed)

## 2021-07-26 NOTE — Progress Notes (Signed)
? ?  Subjective:  ?Barbara Daniels is a 26 y.o. G1P0 at [redacted]w[redacted]d being seen today for ongoing prenatal care.  She is currently monitored for the following issues for this low-risk pregnancy and has Routine adult health maintenance; Mild intermittent asthma; Obesity affecting pregnancy; GAD (generalized anxiety disorder); and Supervision of other normal pregnancy, antepartum on their problem list. ? ?Patient reports no complaints.  Contractions: Not present. Vag. Bleeding: None.  Movement: Present. Denies leaking of fluid.  ? ?The following portions of the patient's history were reviewed and updated as appropriate: allergies, current medications, past family history, past medical history, past social history, past surgical history and problem list. Problem list updated. ? ?Objective:  ? ?Vitals:  ? 07/26/21 1135  ?BP: 111/66  ?Pulse: 87  ?Weight: 280 lb 14.4 oz (127.4 kg)  ? ? ?Fetal Status: Fetal Heart Rate (bpm): 145   Movement: Present    ? ?General:  Alert, oriented and cooperative. Patient is in no acute distress.  ?Skin: Skin is warm and dry. No rash noted.   ?Cardiovascular: Normal heart rate noted  ?Respiratory: Normal respiratory effort, no problems with respiration noted  ?Abdomen: Soft, gravid, appropriate for gestational age. Pain/Pressure: Absent     ?Pelvic: Vag. Bleeding: None     ?Cervical exam deferred        ?Extremities: Normal range of motion.  Edema: None  ?Mental Status: Normal mood and affect. Normal behavior. Normal judgment and thought content.  ? ?Urinalysis:     ? ?Assessment and Plan:  ?Pregnancy: G1P0 at [redacted]w[redacted]d ? ?1. Supervision of other normal pregnancy, antepartum ?BP and FHR normal ?Advised to be fasting for next visit ? ?2. Mild intermittent asthma without complication ? ? ?3. GAD (generalized anxiety disorder) ?Significant symptoms last visit ?Accepted referral to [facility] and saw her earlier this week, doing much better ? ?4. Obesity affecting pregnancy in second trimester ? ? ?Preterm labor  symptoms and general obstetric precautions including but not limited to vaginal bleeding, contractions, leaking of fluid and fetal movement were reviewed in detail with the patient. ?Please refer to After Visit Summary for other counseling recommendations.  ?Return in 4 weeks (on 08/23/2021) for ob visit, Dyad patient. ? ? ?Venora Maples, MD ? ?

## 2021-07-29 ENCOUNTER — Other Ambulatory Visit: Payer: Self-pay | Admitting: *Deleted

## 2021-07-29 ENCOUNTER — Ambulatory Visit: Payer: Medicaid Other | Attending: Obstetrics

## 2021-07-29 ENCOUNTER — Ambulatory Visit: Payer: Medicaid Other | Admitting: *Deleted

## 2021-07-29 VITALS — BP 129/66 | HR 75

## 2021-07-29 DIAGNOSIS — Z6841 Body Mass Index (BMI) 40.0 and over, adult: Secondary | ICD-10-CM

## 2021-07-29 DIAGNOSIS — O99512 Diseases of the respiratory system complicating pregnancy, second trimester: Secondary | ICD-10-CM

## 2021-07-29 DIAGNOSIS — O99212 Obesity complicating pregnancy, second trimester: Secondary | ICD-10-CM | POA: Diagnosis not present

## 2021-07-29 DIAGNOSIS — J45909 Unspecified asthma, uncomplicated: Secondary | ICD-10-CM

## 2021-07-29 DIAGNOSIS — E669 Obesity, unspecified: Secondary | ICD-10-CM | POA: Diagnosis not present

## 2021-07-29 DIAGNOSIS — Z362 Encounter for other antenatal screening follow-up: Secondary | ICD-10-CM | POA: Diagnosis not present

## 2021-07-29 DIAGNOSIS — Z3480 Encounter for supervision of other normal pregnancy, unspecified trimester: Secondary | ICD-10-CM | POA: Insufficient documentation

## 2021-07-29 DIAGNOSIS — Z3A23 23 weeks gestation of pregnancy: Secondary | ICD-10-CM

## 2021-08-02 ENCOUNTER — Encounter: Payer: Medicaid Other | Admitting: Family Medicine

## 2021-08-06 ENCOUNTER — Ambulatory Visit: Payer: Medicaid Other | Admitting: Clinical

## 2021-08-06 ENCOUNTER — Other Ambulatory Visit: Payer: Self-pay

## 2021-08-06 ENCOUNTER — Inpatient Hospital Stay (HOSPITAL_COMMUNITY)
Admission: AD | Admit: 2021-08-06 | Discharge: 2021-08-06 | Disposition: A | Discharge status: Home / Self Care | Payer: Medicaid Other | Attending: Obstetrics and Gynecology | Admitting: Obstetrics and Gynecology

## 2021-08-06 DIAGNOSIS — O98512 Other viral diseases complicating pregnancy, second trimester: Secondary | ICD-10-CM | POA: Diagnosis present

## 2021-08-06 DIAGNOSIS — Z3A24 24 weeks gestation of pregnancy: Secondary | ICD-10-CM | POA: Diagnosis not present

## 2021-08-06 DIAGNOSIS — Z91199 Patient's noncompliance with other medical treatment and regimen due to unspecified reason: Secondary | ICD-10-CM

## 2021-08-06 DIAGNOSIS — B309 Viral conjunctivitis, unspecified: Secondary | ICD-10-CM | POA: Insufficient documentation

## 2021-08-06 NOTE — MAU Note (Signed)
Barbara Daniels is a 26 y.o. at [redacted]w[redacted]d here in MAU reporting: redness,swelling and drainage in left eye for past 5 days.  States eye is itchy and draining yellow/clear discharge.  Eye is crusted shut upon waking. ? ?Onset of complaint: 5 days  ?Pain score: 0 ?Vitals:  ? 08/06/21 0819  ?BP: 124/67  ?Pulse: 78  ?Resp: 18  ?Temp: 98.1 ?F (36.7 ?C)  ?SpO2: 97%  ?   ?FHT: 138 bpm ?Lab orders placed from triage:   None ?

## 2021-08-06 NOTE — MAU Provider Note (Signed)
?History  ?  ? ?[id number] ? ?Arrival date and time: 08/06/21 0807 ?  ? ?Chief Complaint  ?Patient presents with  ? Eye redness, swelling, drainage  ? ? ? ?HPI ?Barbara Daniels is a 26 y.o. at [redacted]w[redacted]d by ultrasound who presents for eye irritation. ?This is day 5 of symptoms. Symptoms affecting left eye only. Reports watery drainage, eye redness, and eye irritation. Last week had a mild cough but otherwise denies any cold like symptoms. 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  ?Does not wear contacts. Denies eye pain or visual changes. Hasn't treated symptoms.  ? ?OB History   ? ? Gravida  ?1  ? Para  ?   ? Term  ?   ? Preterm  ?   ? AB  ?   ? Living  ?   ?  ? ? SAB  ?   ? IAB  ?   ? Ectopic  ?   ? Multiple  ?   ? Live Births  ?   ?   ?  ?  ? ? ?Past Medical History:  ?Diagnosis Date  ? Anxiety   ? Asthma   ? Depression   ? History of chickenpox   ? Hyperlipidemia   ? ? ?Past Surgical History:  ?Procedure Laterality Date  ? WISDOM TOOTH EXTRACTION    ? ? ?Family History  ?Problem Relation Age of Onset  ? Diabetes Father   ? Hypertension Father   ? Diabetes Maternal Grandmother   ? Arthritis Paternal Grandmother   ? Heart failure Paternal Grandfather   ? ? ?Allergies  ?Allergen Reactions  ? Penicillins Hives  ? ? ?No current facility-administered medications on file prior to encounter.  ? ?Current Outpatient Medications on File Prior to Encounter  ?Medication Sig Dispense Refill  ? ondansetron (ZOFRAN-ODT) 8 MG disintegrating tablet DISSOLVE 1 TABLET(8 MG) ON THE TONGUE EVERY 8 HOURS AS NEEDED FOR NAUSEA OR VOMITING 20 tablet 0  ? Prenatal Vit-Fe Fumarate-FA (PRENATAL VITAMIN) 27-0.8 MG TABS Take 1 tablet by mouth daily. 30 tablet 11  ? ? ? ?ROS ?Pertinent positives and negative per HPI, all others reviewed and negative ? ?Physical Exam  ? ?BP 124/67 (BP Location: Right Arm)   Pulse 78   Temp 98.1 ?F (36.7 ?C) (Oral)   Resp 18   Ht 5\' 10"  (1.778 m)   Wt 125.3 kg   LMP 02/17/2021   SpO2 97%    BMI 39.64 kg/m?  ? ?Patient Vitals for the past 24 hrs: ? BP Temp Temp src Pulse Resp SpO2 Height Weight  ?08/06/21 0819 124/67 98.1 ?F (36.7 ?C) Oral 78 18 97 % -- --  ?08/06/21 0813 -- -- -- -- -- -- 5\' 10"  (1.778 m) 125.3 kg  ? ? ?Physical Exam ?Vitals and nursing note reviewed.  ?Constitutional:   ?   General: She is not in acute distress. ?   Appearance: Normal appearance.  ?Eyes:  ?   General: No scleral icterus.    ?   Right eye: No foreign body or discharge.     ?   Left eye: Discharge (clear watery discharge) present.No foreign body.  ?   Extraocular Movements:  ?   Right eye: No nystagmus.  ?   Left eye: No nystagmus.  ?   Conjunctiva/sclera:  ?   Right eye: Right conjunctiva is not injected.  ?   Left eye: Left conjunctiva is injected.  ?Pulmonary:  ?  Effort: Pulmonary effort is normal. No respiratory distress.  ?Skin: ?   General: Skin is warm and dry.  ?Neurological:  ?   Mental Status: She is alert.  ?Psychiatric:     ?   Mood and Affect: Mood normal.     ?   Behavior: Behavior normal.  ?  ?Labs ?No results found for this or any previous visit (from the past 24 hour(s)). ? ?Imaging ?No results found. ? ?MAU Course  ?Procedures ?Lab Orders  ?No laboratory test(s) ordered today  ? ?No orders of the defined types were placed in this encounter. ? ?Imaging Orders  ?No imaging studies ordered today  ? ? ?MDM ?FHT present via doppler ? ?Patient presents with left eye drainage & irritation. Exam consistent with viral conjunctivitis.  ?Assessment and Plan  ? ?1. Viral conjunctivitis of left eye   ?2. [redacted] weeks gestation of pregnancy   ? ?-Keep eye clean. Can use OTC drops for discomfort ?-Give work note ?-F/u with urgent care or PCP if symptoms don't improve, additional symptoms develop, or if symptoms worsen ? ?Judeth Horn, NP ?08/06/21 ?9:24 AM ? ? ?

## 2021-08-07 ENCOUNTER — Telehealth: Payer: Medicaid Other | Admitting: Physician Assistant

## 2021-08-07 DIAGNOSIS — H1032 Unspecified acute conjunctivitis, left eye: Secondary | ICD-10-CM

## 2021-08-07 MED ORDER — POLYMYXIN B-TRIMETHOPRIM 10000-0.1 UNIT/ML-% OP SOLN: 1.0000 [drp] | OPHTHALMIC | 0 refills | Status: DC

## 2021-08-07 NOTE — Progress Notes (Signed)

## 2021-08-29 ENCOUNTER — Other Ambulatory Visit: Payer: Self-pay

## 2021-08-29 DIAGNOSIS — Z3480 Encounter for supervision of other normal pregnancy, unspecified trimester: Secondary | ICD-10-CM

## 2021-08-30 ENCOUNTER — Encounter: Payer: Self-pay | Admitting: Family Medicine

## 2021-08-30 ENCOUNTER — Ambulatory Visit (INDEPENDENT_AMBULATORY_CARE_PROVIDER_SITE_OTHER): Payer: Medicaid Other | Admitting: Family Medicine

## 2021-08-30 ENCOUNTER — Other Ambulatory Visit: Payer: Medicaid Other

## 2021-08-30 VITALS — BP 132/88 | HR 88 | Wt 276.3 lb

## 2021-08-30 DIAGNOSIS — L299 Pruritus, unspecified: Secondary | ICD-10-CM

## 2021-08-30 DIAGNOSIS — O99212 Obesity complicating pregnancy, second trimester: Secondary | ICD-10-CM

## 2021-08-30 DIAGNOSIS — F411 Generalized anxiety disorder: Secondary | ICD-10-CM

## 2021-08-30 DIAGNOSIS — Z3480 Encounter for supervision of other normal pregnancy, unspecified trimester: Secondary | ICD-10-CM

## 2021-08-30 DIAGNOSIS — J4520 Mild intermittent asthma, uncomplicated: Secondary | ICD-10-CM

## 2021-08-30 MED ORDER — LORATADINE 10 MG PO TABS: 10.0000 mg | ORAL_TABLET | Freq: Every day | ORAL | 5 refills | Status: DC

## 2021-08-30 NOTE — Patient Instructions (Signed)

## 2021-08-30 NOTE — Progress Notes (Signed)
   Subjective:  Barbara Daniels is a 26 y.o. G1P0 at [redacted]w[redacted]d being seen today for ongoing prenatal care.  She is currently monitored for the following issues for this low-risk pregnancy and has Routine adult health maintenance; Mild intermittent asthma; Obesity affecting pregnancy; GAD (generalized anxiety disorder); and Supervision of other normal pregnancy, antepartum on their problem list.  Patient reports no complaints.  Contractions: Not present. Vag. Bleeding: None.  Movement: Present. Denies leaking of fluid.   The following portions of the patient's history were reviewed and updated as appropriate: allergies, current medications, past family history, past medical history, past social history, past surgical history and problem list. Problem list updated.  Objective:   Vitals:   08/30/21 0834  BP: 132/88  Pulse: 88  Weight: 276 lb 4.8 oz (125.3 kg)    Fetal Status: Fetal Heart Rate (bpm): 135   Movement: Present     General:  Alert, oriented and cooperative. Patient is in no acute distress.  Skin: Skin is warm and dry. No rash noted.   Cardiovascular: Normal heart rate noted  Respiratory: Normal respiratory effort, no problems with respiration noted  Abdomen: Soft, gravid, appropriate for gestational age. Pain/Pressure: Absent     Pelvic: Vag. Bleeding: None     Cervical exam deferred        Extremities: Normal range of motion.     Mental Status: Normal mood and affect. Normal behavior. Normal judgment and thought content.   Urinalysis:      Assessment and Plan:  Pregnancy: G1P0 at [redacted]w[redacted]d  1. Supervision of other normal pregnancy, antepartum BP and FHR normal Prefers to defer tdap to pp period 28 wk labs today Reports she has had itching all over her body including palms and soles of her feet Worst areas of itching are her feet and her back Will get CMP and bile acids today Trial claritin for symptoms  2. Obesity affecting pregnancy in second trimester   3. GAD  (generalized anxiety disorder) stable  4. Mild intermittent asthma without complication stable  Preterm labor symptoms and general obstetric precautions including but not limited to vaginal bleeding, contractions, leaking of fluid and fetal movement were reviewed in detail with the patient. Please refer to After Visit Summary for other counseling recommendations.  Return in 2 weeks (on 09/13/2021) for Dyad patient, ob visit.   Venora Maples, MD

## 2021-08-31 LAB — COMPREHENSIVE METABOLIC PANEL
ALT: 22 IU/L (ref 0–32)
AST: 15 IU/L (ref 0–40)
Albumin/Globulin Ratio: 1.3 (ref 1.2–2.2)
Albumin: 3.7 g/dL — ABNORMAL LOW (ref 3.9–5.0)
Alkaline Phosphatase: 62 IU/L (ref 44–121)
BUN/Creatinine Ratio: 10 (ref 9–23)
BUN: 6 mg/dL (ref 6–20)
Bilirubin Total: <0.2 mg/dL (ref 0.0–1.2)
CO2: 19 mmol/L — ABNORMAL LOW (ref 20–29)
Calcium: 9 mg/dL (ref 8.7–10.2)
Chloride: 103 mmol/L (ref 96–106)
Creatinine, Ser: 0.63 mg/dL (ref 0.57–1.00)
Globulin, Total: 2.8 g/dL (ref 1.5–4.5)
Glucose: 127 mg/dL — ABNORMAL HIGH (ref 70–99)
Potassium: 3.7 mmol/L (ref 3.5–5.2)
Sodium: 136 mmol/L (ref 134–144)
Total Protein: 6.5 g/dL (ref 6.0–8.5)
eGFR: 127 mL/min/{1.73_m2} (ref >59)

## 2021-08-31 LAB — RPR: RPR Ser Ql: NONREACTIVE

## 2021-08-31 LAB — GLUCOSE TOLERANCE, 2 HOURS W/ 1HR
Glucose, 1 hour: 127 mg/dL (ref 70–179)
Glucose, 2 hour: 100 mg/dL (ref 70–152)
Glucose, Fasting: 80 mg/dL (ref 70–91)

## 2021-08-31 LAB — BILE ACIDS, TOTAL: Bile Acids Total: <1 umol/L (ref 0.0–10.0)

## 2021-08-31 LAB — CBC
Hematocrit: 41.1 % (ref 34.0–46.6)
Hemoglobin: 14.4 g/dL (ref 11.1–15.9)
MCH: 31.5 pg (ref 26.6–33.0)
MCHC: 35 g/dL (ref 31.5–35.7)
MCV: 90 fL (ref 79–97)
Platelets: 252 10*3/uL (ref 150–450)
RBC: 4.57 x10E6/uL (ref 3.77–5.28)
RDW: 12.4 % (ref 11.7–15.4)
WBC: 6.4 10*3/uL (ref 3.4–10.8)

## 2021-08-31 LAB — HIV ANTIBODY (ROUTINE TESTING W REFLEX): HIV Screen 4th Generation wRfx: NONREACTIVE

## 2021-09-02 ENCOUNTER — Other Ambulatory Visit: Payer: Self-pay | Admitting: *Deleted

## 2021-09-02 ENCOUNTER — Ambulatory Visit: Payer: Medicaid Other | Attending: Obstetrics and Gynecology

## 2021-09-02 ENCOUNTER — Ambulatory Visit: Payer: Medicaid Other | Admitting: *Deleted

## 2021-09-02 VITALS — BP 118/71 | HR 75

## 2021-09-02 DIAGNOSIS — J45909 Unspecified asthma, uncomplicated: Secondary | ICD-10-CM | POA: Insufficient documentation

## 2021-09-02 DIAGNOSIS — O99891 Other specified diseases and conditions complicating pregnancy: Secondary | ICD-10-CM | POA: Diagnosis not present

## 2021-09-02 DIAGNOSIS — Z3480 Encounter for supervision of other normal pregnancy, unspecified trimester: Secondary | ICD-10-CM

## 2021-09-02 DIAGNOSIS — Z362 Encounter for other antenatal screening follow-up: Secondary | ICD-10-CM | POA: Insufficient documentation

## 2021-09-02 DIAGNOSIS — O99213 Obesity complicating pregnancy, third trimester: Secondary | ICD-10-CM | POA: Diagnosis not present

## 2021-09-02 DIAGNOSIS — Z3A28 28 weeks gestation of pregnancy: Secondary | ICD-10-CM

## 2021-09-02 DIAGNOSIS — O99513 Diseases of the respiratory system complicating pregnancy, third trimester: Secondary | ICD-10-CM | POA: Insufficient documentation

## 2021-09-02 DIAGNOSIS — O99212 Obesity complicating pregnancy, second trimester: Secondary | ICD-10-CM | POA: Insufficient documentation

## 2021-09-02 DIAGNOSIS — E669 Obesity, unspecified: Secondary | ICD-10-CM

## 2021-09-02 DIAGNOSIS — Z6839 Body mass index (BMI) 39.0-39.9, adult: Secondary | ICD-10-CM

## 2021-09-11 ENCOUNTER — Encounter: Payer: Self-pay | Admitting: Family Medicine

## 2021-09-11 ENCOUNTER — Ambulatory Visit (INDEPENDENT_AMBULATORY_CARE_PROVIDER_SITE_OTHER): Payer: Medicaid Other | Admitting: Family Medicine

## 2021-09-11 VITALS — BP 128/82 | HR 89 | Wt 285.4 lb

## 2021-09-11 DIAGNOSIS — Z3480 Encounter for supervision of other normal pregnancy, unspecified trimester: Secondary | ICD-10-CM

## 2021-09-11 NOTE — Patient Instructions (Signed)

## 2021-09-11 NOTE — Progress Notes (Signed)
   Subjective:  Barbara Daniels is a 26 y.o. G1P0 at [redacted]w[redacted]d being seen today for ongoing prenatal care.  She is currently monitored for the following issues for this low-risk pregnancy and has Routine adult health maintenance; Mild intermittent asthma; Obesity affecting pregnancy; GAD (generalized anxiety disorder); and Supervision of other normal pregnancy, antepartum on their problem list.  Patient reports no complaints.  Contractions: Not present. Vag. Bleeding: None.  Movement: Present. Denies leaking of fluid.   The following portions of the patient's history were reviewed and updated as appropriate: allergies, current medications, past family history, past medical history, past social history, past surgical history and problem list. Problem list updated.  Objective:   Vitals:   09/11/21 1124  BP: 128/82  Pulse: 89  Weight: 285 lb 6.4 oz (129.5 kg)    Fetal Status: Fetal Heart Rate (bpm): 147   Movement: Present     General:  Alert, oriented and cooperative. Patient is in no acute distress.  Skin: Skin is warm and dry. No rash noted.   Cardiovascular: Normal heart rate noted  Respiratory: Normal respiratory effort, no problems with respiration noted  Abdomen: Soft, gravid, appropriate for gestational age. Pain/Pressure: Absent     Pelvic: Vag. Bleeding: None     Cervical exam deferred        Extremities: Normal range of motion.     Mental Status: Normal mood and affect. Normal behavior. Normal judgment and thought content.   Urinalysis:      Assessment and Plan:  Pregnancy: G1P0 at [redacted]w[redacted]d  1. Supervision of other normal pregnancy, antepartum BP and FHR normal Itching from last visit resolved with claritin Bile acids and CMP were normal  Preterm labor symptoms and general obstetric precautions including but not limited to vaginal bleeding, contractions, leaking of fluid and fetal movement were reviewed in detail with the patient. Please refer to After Visit Summary for other  counseling recommendations.  Return in 2 weeks (on 09/25/2021) for Dyad patient, ob visit.   Venora Maples, MD

## 2021-09-25 ENCOUNTER — Ambulatory Visit (INDEPENDENT_AMBULATORY_CARE_PROVIDER_SITE_OTHER): Payer: Medicaid Other | Admitting: Certified Nurse Midwife

## 2021-09-25 VITALS — BP 126/75 | HR 91 | Wt 291.2 lb

## 2021-09-25 DIAGNOSIS — Z3493 Encounter for supervision of normal pregnancy, unspecified, third trimester: Secondary | ICD-10-CM

## 2021-09-25 DIAGNOSIS — Z3A31 31 weeks gestation of pregnancy: Secondary | ICD-10-CM

## 2021-09-25 MED ORDER — COMFORT FIT MATERNITY SUPP LG MISC: 1.0000 | Freq: Once | 0 refills | Status: AC

## 2021-09-25 MED ORDER — COMFORT FIT MATERNITY SUPP LG MISC: 1.0000 | Freq: Once | 0 refills | Status: DC

## 2021-09-25 NOTE — Progress Notes (Signed)
PRENATAL VISIT NOTE  Subjective:  Barbara Daniels is a 26 y.o. G1P0 at [redacted]w[redacted]d being seen today for ongoing prenatal care.  She is currently monitored for the following issues for this low-risk pregnancy and has Routine adult health maintenance; Mild intermittent asthma; Obesity affecting pregnancy; GAD (generalized anxiety disorder); and Supervision of other normal pregnancy, antepartum on their problem list.  Patient reports no complaints.  Contractions: Irritability. Vag. Bleeding: None.  Movement: Present. Denies leaking of fluid.   The following portions of the patient's history were reviewed and updated as appropriate: allergies, current medications, past family history, past medical history, past social history, past surgical history and problem list.   Objective:   Vitals:   09/25/21 1540  BP: 126/75  Pulse: 91  Weight: 291 lb 3.2 oz (132.1 kg)    Fetal Status: Fetal Heart Rate (bpm): 138 Fundal Height: 31 cm Movement: Present     General:  Alert, oriented and cooperative. Patient is in no acute distress.  Skin: Skin is warm and dry. No rash noted.   Cardiovascular: Normal heart rate noted  Respiratory: Normal respiratory effort, no problems with respiration noted  Abdomen: Soft, gravid, appropriate for gestational age.  Pain/Pressure: Present     Pelvic: Cervical exam deferred        Extremities: Normal range of motion.  Edema: None  Mental Status: Normal mood and affect. Normal behavior. Normal judgment and thought content.   Assessment and Plan:  Pregnancy: G1P0 at [redacted]w[redacted]d 1. Encounter for supervision of low-risk pregnancy in third trimester - Doing well, feeling regular and vigorous fetal movement  - Elastic Bandages & Supports (COMFORT FIT MATERNITY SUPP LG) MISC; 1 Device by Does not apply route once for 1 dose.  Dispense: 1 each; Refill: 0  2. [redacted] weeks gestation of pregnancy - Routine OB care  - Pt is interested in waterbirth.  No contraindications at this time per  chart review/patient assessment.   - Discussed waterbirth as option for low-risk pregnancy.  Reviewed conditions that may arise during pregnancy that will risk pt out of waterbirth including hypertension, diabetes, fetal growth restriction <10%ile, etc. - Pt has attended the class, will upload her certificate - Reviewed conditions in labor that will risk her out of water immersion including thick meconium or blood stained amniotic fluid, non-reassuring fetal status on monitor, excessive bleeding, hypertension, dizziness, use of IV meds, damaged equipment or staffing that does not allow for water immersion, etc.  - The attending midwife must be on the unit for water immersion to begin; pt understands this may delay the start of water immersion. - Reminded pt that signing consent in labor at the hospital also acknowledges they will exit the tub if the attending midwife requests. - Consent given to patient for review.  Consent will be reviewed and signed at the hospital by the waterbirth provider prior to use of the tub. - Discussed other labor support options if waterbirth becomes unavailable, including position change, freedom of movement, use of birthing ball, and/or use of hydrotherapy in the shower (dependent upon medical condition/provider discretion).   Preterm labor symptoms and general obstetric precautions including but not limited to vaginal bleeding, contractions, leaking of fluid and fetal movement were reviewed in detail with the patient. Please refer to After Visit Summary for other counseling recommendations.   Return in about 2 weeks (around 10/09/2021) for MBROB, IN-PERSON.  Future Appointments  Date Time Provider Department Center  09/30/2021 10:30 AM [facility] NURSE [facility]  09/30/2021  10:45 AM [facility] US4 [facility]  10/07/2021  9:15 AM [facility] NURSE [facility]  10/07/2021  9:30 AM [facility] US3 [facility]  10/07/2021  1:35 PM Venora Maples, MD [facility]  10/14/2021  9:15  AM [facility] NURSE [facility]  10/14/2021  9:30 AM [facility] US3 [facility]  10/30/2021 10:55 AM Venora Maples, MD [facility]  11/06/2021  9:35 AM Venora Maples, MD [facility]   Bernerd Limbo, CNM

## 2021-09-27 ENCOUNTER — Ambulatory Visit (INDEPENDENT_AMBULATORY_CARE_PROVIDER_SITE_OTHER): Payer: Medicaid Other | Admitting: *Deleted

## 2021-09-27 VITALS — BP 115/72 | HR 84 | Wt 290.4 lb

## 2021-09-27 DIAGNOSIS — O368130 Decreased fetal movements, third trimester, not applicable or unspecified: Secondary | ICD-10-CM

## 2021-09-27 NOTE — Progress Notes (Signed)
Pt reports decreased FM today.  She observed good FM during NST today.

## 2021-09-30 ENCOUNTER — Ambulatory Visit: Payer: Medicaid Other | Admitting: *Deleted

## 2021-09-30 ENCOUNTER — Encounter: Payer: Self-pay | Admitting: *Deleted

## 2021-09-30 ENCOUNTER — Ambulatory Visit: Payer: Medicaid Other | Attending: Obstetrics

## 2021-09-30 ENCOUNTER — Other Ambulatory Visit: Payer: Self-pay | Admitting: *Deleted

## 2021-09-30 VITALS — BP 137/71 | HR 86

## 2021-09-30 DIAGNOSIS — O99213 Obesity complicating pregnancy, third trimester: Secondary | ICD-10-CM

## 2021-09-30 DIAGNOSIS — Z6839 Body mass index (BMI) 39.0-39.9, adult: Secondary | ICD-10-CM

## 2021-09-30 DIAGNOSIS — E669 Obesity, unspecified: Secondary | ICD-10-CM

## 2021-09-30 DIAGNOSIS — Z3A32 32 weeks gestation of pregnancy: Secondary | ICD-10-CM | POA: Diagnosis not present

## 2021-09-30 DIAGNOSIS — J45909 Unspecified asthma, uncomplicated: Secondary | ICD-10-CM | POA: Insufficient documentation

## 2021-09-30 DIAGNOSIS — O99513 Diseases of the respiratory system complicating pregnancy, third trimester: Secondary | ICD-10-CM | POA: Insufficient documentation

## 2021-09-30 DIAGNOSIS — O283 Abnormal ultrasonic finding on antenatal screening of mother: Secondary | ICD-10-CM

## 2021-10-07 ENCOUNTER — Encounter: Payer: Self-pay | Admitting: *Deleted

## 2021-10-07 ENCOUNTER — Ambulatory Visit: Payer: Medicaid Other | Attending: Obstetrics

## 2021-10-07 ENCOUNTER — Encounter: Payer: Self-pay | Admitting: Family Medicine

## 2021-10-07 ENCOUNTER — Ambulatory Visit (INDEPENDENT_AMBULATORY_CARE_PROVIDER_SITE_OTHER): Payer: Medicaid Other | Admitting: Family Medicine

## 2021-10-07 ENCOUNTER — Ambulatory Visit: Payer: Medicaid Other | Admitting: *Deleted

## 2021-10-07 ENCOUNTER — Other Ambulatory Visit: Payer: Self-pay

## 2021-10-07 VITALS — BP 113/66 | HR 99

## 2021-10-07 VITALS — BP 114/76 | HR 106 | Wt 284.1 lb

## 2021-10-07 DIAGNOSIS — E669 Obesity, unspecified: Secondary | ICD-10-CM

## 2021-10-07 DIAGNOSIS — Z3A33 33 weeks gestation of pregnancy: Secondary | ICD-10-CM | POA: Diagnosis not present

## 2021-10-07 DIAGNOSIS — Z6839 Body mass index (BMI) 39.0-39.9, adult: Secondary | ICD-10-CM | POA: Insufficient documentation

## 2021-10-07 DIAGNOSIS — J45909 Unspecified asthma, uncomplicated: Secondary | ICD-10-CM | POA: Insufficient documentation

## 2021-10-07 DIAGNOSIS — Z3480 Encounter for supervision of other normal pregnancy, unspecified trimester: Secondary | ICD-10-CM

## 2021-10-07 DIAGNOSIS — J4520 Mild intermittent asthma, uncomplicated: Secondary | ICD-10-CM

## 2021-10-07 DIAGNOSIS — O99212 Obesity complicating pregnancy, second trimester: Secondary | ICD-10-CM

## 2021-10-07 DIAGNOSIS — O99513 Diseases of the respiratory system complicating pregnancy, third trimester: Secondary | ICD-10-CM | POA: Diagnosis present

## 2021-10-07 DIAGNOSIS — O99213 Obesity complicating pregnancy, third trimester: Secondary | ICD-10-CM | POA: Diagnosis not present

## 2021-10-07 MED ORDER — FLUTICASONE PROPIONATE 50 MCG/ACT NA SUSP: 1.0000 | Freq: Every day | NASAL | 2 refills | Status: DC

## 2021-10-14 ENCOUNTER — Other Ambulatory Visit: Payer: Self-pay | Admitting: Obstetrics

## 2021-10-14 ENCOUNTER — Encounter: Payer: Self-pay | Admitting: *Deleted

## 2021-10-14 ENCOUNTER — Ambulatory Visit: Payer: Medicaid Other | Attending: Obstetrics

## 2021-10-14 ENCOUNTER — Ambulatory Visit: Payer: Medicaid Other | Admitting: *Deleted

## 2021-10-14 VITALS — BP 119/65 | HR 81

## 2021-10-14 DIAGNOSIS — J45909 Unspecified asthma, uncomplicated: Secondary | ICD-10-CM

## 2021-10-14 DIAGNOSIS — Z9289 Personal history of other medical treatment: Secondary | ICD-10-CM | POA: Insufficient documentation

## 2021-10-14 DIAGNOSIS — Z6839 Body mass index (BMI) 39.0-39.9, adult: Secondary | ICD-10-CM

## 2021-10-14 DIAGNOSIS — Z3480 Encounter for supervision of other normal pregnancy, unspecified trimester: Secondary | ICD-10-CM

## 2021-10-14 DIAGNOSIS — Z3A34 34 weeks gestation of pregnancy: Secondary | ICD-10-CM

## 2021-10-14 DIAGNOSIS — O99213 Obesity complicating pregnancy, third trimester: Secondary | ICD-10-CM | POA: Diagnosis not present

## 2021-10-14 DIAGNOSIS — O99513 Diseases of the respiratory system complicating pregnancy, third trimester: Secondary | ICD-10-CM

## 2021-10-14 DIAGNOSIS — E669 Obesity, unspecified: Secondary | ICD-10-CM

## 2021-10-14 NOTE — Procedures (Signed)
Barbara Daniels [birth year] [redacted]w[redacted]d  Fetus A Non-Stress Test Interpretation for 10/14/21  Indication: Unsatisfactory BPP  Fetal Heart Rate A Mode: External Baseline Rate (A): 130 bpm Variability: Moderate Accelerations: 15 x 15 Decelerations: None Multiple birth?: No  Uterine Activity Mode: Palpation, Toco Contraction Frequency (min): None  Interpretation (Fetal Testing) Nonstress Test Interpretation: Reactive Comments: Dr. Parke Poisson reviewed tracing.

## 2021-10-21 ENCOUNTER — Ambulatory Visit: Payer: Medicaid Other | Attending: Obstetrics

## 2021-10-21 ENCOUNTER — Ambulatory Visit: Payer: Medicaid Other | Admitting: *Deleted

## 2021-10-21 VITALS — BP 118/71 | HR 79

## 2021-10-21 DIAGNOSIS — E669 Obesity, unspecified: Secondary | ICD-10-CM

## 2021-10-21 DIAGNOSIS — O99513 Diseases of the respiratory system complicating pregnancy, third trimester: Secondary | ICD-10-CM

## 2021-10-21 DIAGNOSIS — Z3A35 35 weeks gestation of pregnancy: Secondary | ICD-10-CM | POA: Diagnosis not present

## 2021-10-21 DIAGNOSIS — O99213 Obesity complicating pregnancy, third trimester: Secondary | ICD-10-CM | POA: Diagnosis not present

## 2021-10-21 DIAGNOSIS — O283 Abnormal ultrasonic finding on antenatal screening of mother: Secondary | ICD-10-CM | POA: Insufficient documentation

## 2021-10-21 DIAGNOSIS — Z6839 Body mass index (BMI) 39.0-39.9, adult: Secondary | ICD-10-CM | POA: Insufficient documentation

## 2021-10-21 DIAGNOSIS — J45909 Unspecified asthma, uncomplicated: Secondary | ICD-10-CM

## 2021-10-21 DIAGNOSIS — O09513 Supervision of elderly primigravida, third trimester: Secondary | ICD-10-CM | POA: Diagnosis not present

## 2021-10-28 ENCOUNTER — Ambulatory Visit: Payer: Medicaid Other | Admitting: *Deleted

## 2021-10-28 ENCOUNTER — Ambulatory Visit: Payer: Medicaid Other | Attending: Obstetrics

## 2021-10-28 VITALS — BP 117/67 | HR 76

## 2021-10-28 DIAGNOSIS — Z6839 Body mass index (BMI) 39.0-39.9, adult: Secondary | ICD-10-CM | POA: Insufficient documentation

## 2021-10-28 DIAGNOSIS — E669 Obesity, unspecified: Secondary | ICD-10-CM | POA: Diagnosis not present

## 2021-10-28 DIAGNOSIS — Z3A36 36 weeks gestation of pregnancy: Secondary | ICD-10-CM | POA: Diagnosis not present

## 2021-10-28 DIAGNOSIS — O99213 Obesity complicating pregnancy, third trimester: Secondary | ICD-10-CM | POA: Insufficient documentation

## 2021-10-28 DIAGNOSIS — O99513 Diseases of the respiratory system complicating pregnancy, third trimester: Secondary | ICD-10-CM | POA: Diagnosis not present

## 2021-10-28 DIAGNOSIS — O283 Abnormal ultrasonic finding on antenatal screening of mother: Secondary | ICD-10-CM | POA: Diagnosis present

## 2021-10-28 DIAGNOSIS — J45909 Unspecified asthma, uncomplicated: Secondary | ICD-10-CM | POA: Insufficient documentation

## 2021-10-29 ENCOUNTER — Other Ambulatory Visit: Payer: Self-pay | Admitting: *Deleted

## 2021-10-29 DIAGNOSIS — Z6839 Body mass index (BMI) 39.0-39.9, adult: Secondary | ICD-10-CM

## 2021-10-29 DIAGNOSIS — O358XX0 Maternal care for other (suspected) fetal abnormality and damage, not applicable or unspecified: Secondary | ICD-10-CM

## 2021-10-30 ENCOUNTER — Other Ambulatory Visit (HOSPITAL_COMMUNITY)
Admission: RE | Admit: 2021-10-30 | Discharge: 2021-10-30 | Disposition: A | Discharge status: Home / Self Care | Payer: Medicaid Other | Source: Ambulatory Visit | Attending: Family Medicine | Admitting: Family Medicine

## 2021-10-30 ENCOUNTER — Other Ambulatory Visit: Payer: Self-pay

## 2021-10-30 ENCOUNTER — Ambulatory Visit (INDEPENDENT_AMBULATORY_CARE_PROVIDER_SITE_OTHER): Payer: Medicaid Other | Admitting: Family Medicine

## 2021-10-30 ENCOUNTER — Encounter: Payer: Self-pay | Admitting: Family Medicine

## 2021-10-30 VITALS — BP 118/83 | HR 86 | Wt 298.8 lb

## 2021-10-30 DIAGNOSIS — F411 Generalized anxiety disorder: Secondary | ICD-10-CM

## 2021-10-30 DIAGNOSIS — Z3480 Encounter for supervision of other normal pregnancy, unspecified trimester: Secondary | ICD-10-CM | POA: Diagnosis present

## 2021-10-30 NOTE — Progress Notes (Signed)
   Subjective:  Barbara Daniels is a 26 y.o. G1P0 at [redacted]w[redacted]d being seen today for ongoing prenatal care.  She is currently monitored for the following issues for this low-risk pregnancy and has Routine adult health maintenance; Mild intermittent asthma; Obesity affecting pregnancy; GAD (generalized anxiety disorder); and Supervision of other normal pregnancy, antepartum on their problem list.  Patient reports no complaints.  Contractions: Irritability. Vag. Bleeding: None.  Movement: Present. Denies leaking of fluid.   The following portions of the patient's history were reviewed and updated as appropriate: allergies, current medications, past family history, past medical history, past social history, past surgical history and problem list. Problem list updated.  Objective:   Vitals:   10/30/21 1112  BP: 118/83  Pulse: 86  Weight: 298 lb 12.8 oz (135.5 kg)    Fetal Status: Fetal Heart Rate (bpm): 138   Movement: Present     General:  Alert, oriented and cooperative. Patient is in no acute distress.  Skin: Skin is warm and dry. No rash noted.   Cardiovascular: Normal heart rate noted  Respiratory: Normal respiratory effort, no problems with respiration noted  Abdomen: Soft, gravid, appropriate for gestational age. Pain/Pressure: Present     Pelvic: Vag. Bleeding: None     Cervical exam deferred        Extremities: Normal range of motion.     Mental Status: Normal mood and affect. Normal behavior. Normal judgment and thought content.   Urinalysis:      Assessment and Plan:  Pregnancy: G1P0 at [redacted]w[redacted]d  1. Supervision of other normal pregnancy, antepartum BP and FHR normal Swabs collected today Getting run around from work about FMLA paperwork, encouraged her to obtain specific forms from work and to bring them to front ASAP - GC/Chlamydia probe amp ([place])not at [facility] - Culture, beta strep (group b only)  2. GAD (generalized anxiety disorder) Stable  Preterm labor symptoms  and general obstetric precautions including but not limited to vaginal bleeding, contractions, leaking of fluid and fetal movement were reviewed in detail with the patient. Please refer to After Visit Summary for other counseling recommendations.  Return in 1 week (on 11/06/2021) for Dyad patient, ob visit.   Venora Maples, MD

## 2021-10-30 NOTE — Patient Instructions (Signed)

## 2021-10-31 LAB — GC/CHLAMYDIA PROBE AMP (~~LOC~~) NOT AT ARMC
Chlamydia: NEGATIVE
Comment: NEGATIVE
Comment: NORMAL
Neisseria Gonorrhea: NEGATIVE

## 2021-11-02 ENCOUNTER — Other Ambulatory Visit: Payer: Self-pay

## 2021-11-02 ENCOUNTER — Inpatient Hospital Stay (HOSPITAL_COMMUNITY)
Admission: AD | Admit: 2021-11-02 | Discharge: 2021-11-04 | DRG: 807 | Disposition: A | Discharge status: Home / Self Care | Payer: Medicaid Other | Attending: Obstetrics & Gynecology | Admitting: Obstetrics & Gynecology

## 2021-11-02 ENCOUNTER — Inpatient Hospital Stay (HOSPITAL_COMMUNITY): Payer: Medicaid Other | Admitting: Anesthesiology

## 2021-11-02 ENCOUNTER — Encounter (HOSPITAL_COMMUNITY): Payer: Self-pay | Admitting: Obstetrics & Gynecology

## 2021-11-02 DIAGNOSIS — Z3A36 36 weeks gestation of pregnancy: Secondary | ICD-10-CM

## 2021-11-02 DIAGNOSIS — Z3490 Encounter for supervision of normal pregnancy, unspecified, unspecified trimester: Secondary | ICD-10-CM

## 2021-11-02 DIAGNOSIS — O99820 Streptococcus B carrier state complicating pregnancy: Secondary | ICD-10-CM | POA: Diagnosis not present

## 2021-11-02 DIAGNOSIS — O26893 Other specified pregnancy related conditions, third trimester: Secondary | ICD-10-CM | POA: Diagnosis present

## 2021-11-02 DIAGNOSIS — O99214 Obesity complicating childbirth: Secondary | ICD-10-CM | POA: Diagnosis present

## 2021-11-02 DIAGNOSIS — O99824 Streptococcus B carrier state complicating childbirth: Secondary | ICD-10-CM | POA: Diagnosis present

## 2021-11-02 DIAGNOSIS — Z880 Allergy status to penicillin: Secondary | ICD-10-CM

## 2021-11-02 DIAGNOSIS — Z3480 Encounter for supervision of other normal pregnancy, unspecified trimester: Principal | ICD-10-CM

## 2021-11-02 DIAGNOSIS — O4202 Full-term premature rupture of membranes, onset of labor within 24 hours of rupture: Secondary | ICD-10-CM | POA: Diagnosis not present

## 2021-11-02 LAB — CBC
HCT: 38.9 % (ref 36.0–46.0)
Hemoglobin: 13.8 g/dL (ref 12.0–15.0)
MCH: 30.9 pg (ref 26.0–34.0)
MCHC: 35.5 g/dL (ref 30.0–36.0)
MCV: 87.2 fL (ref 80.0–100.0)
Platelets: 248 10*3/uL (ref 150–400)
RBC: 4.46 MIL/uL (ref 3.87–5.11)
RDW: 12.9 % (ref 11.5–15.5)
WBC: 8.2 10*3/uL (ref 4.0–10.5)
nRBC: 0 % (ref 0.0–0.2)

## 2021-11-02 LAB — TYPE AND SCREEN
ABO/RH(D): O POS
Antibody Screen: NEGATIVE

## 2021-11-02 LAB — POCT FERN TEST: POCT Fern Test: POSITIVE

## 2021-11-02 LAB — CULTURE, BETA STREP (GROUP B ONLY): Strep Gp B Culture: POSITIVE — AB

## 2021-11-02 MED ORDER — OXYCODONE-ACETAMINOPHEN 5-325 MG PO TABS: 1.0000 | ORAL_TABLET | ORAL | Status: DC | PRN

## 2021-11-02 MED ORDER — ACETAMINOPHEN 325 MG PO TABS: 650.0000 mg | ORAL_TABLET | ORAL | Status: DC | PRN

## 2021-11-02 MED ORDER — FENTANYL-BUPIVACAINE-NACL 0.5-0.125-0.9 MG/250ML-% EP SOLN
12.0000 mL/h | EPIDURAL | Status: DC | PRN
  Administered 2021-11-02: 12 mL/h via EPIDURAL
  Filled 2021-11-02: qty 250

## 2021-11-02 MED ORDER — PHENYLEPHRINE 80 MCG/ML (10ML) SYRINGE FOR IV PUSH (FOR BLOOD PRESSURE SUPPORT): 80.0000 ug | PREFILLED_SYRINGE | INTRAVENOUS | Status: DC | PRN

## 2021-11-02 MED ORDER — CEFAZOLIN SODIUM-DEXTROSE 1-4 GM/50ML-% IV SOLN
1.0000 g | Freq: Three times a day (TID) | INTRAVENOUS | Status: DC
  Filled 2021-11-02 (×4): qty 50

## 2021-11-02 MED ORDER — OXYCODONE-ACETAMINOPHEN 5-325 MG PO TABS: 2.0000 | ORAL_TABLET | ORAL | Status: DC | PRN

## 2021-11-02 MED ORDER — DIPHENHYDRAMINE HCL 50 MG/ML IJ SOLN: 12.5000 mg | INTRAMUSCULAR | Status: DC | PRN

## 2021-11-02 MED ORDER — BENZOCAINE-MENTHOL 20-0.5 % EX AERO
1.0000 | INHALATION_SPRAY | CUTANEOUS | Status: DC | PRN
  Administered 2021-11-02: 1 via TOPICAL
  Filled 2021-11-02: qty 56

## 2021-11-02 MED ORDER — TERBUTALINE SULFATE 1 MG/ML IJ SOLN: 0.2500 mg | Freq: Once | INTRAMUSCULAR | Status: DC | PRN

## 2021-11-02 MED ORDER — ONDANSETRON HCL 4 MG/2ML IJ SOLN: 4.0000 mg | INTRAMUSCULAR | Status: DC | PRN

## 2021-11-02 MED ORDER — EPHEDRINE 5 MG/ML INJ: 10.0000 mg | INTRAVENOUS | Status: DC | PRN

## 2021-11-02 MED ORDER — PRENATAL MULTIVITAMIN CH
1.0000 | ORAL_TABLET | Freq: Every day | ORAL | Status: DC
  Administered 2021-11-03: 1 via ORAL
  Filled 2021-11-02 (×2): qty 1

## 2021-11-02 MED ORDER — OXYTOCIN-SODIUM CHLORIDE 30-0.9 UT/500ML-% IV SOLN
2.5000 [IU]/h | INTRAVENOUS | Status: DC
  Filled 2021-11-02: qty 500

## 2021-11-02 MED ORDER — LACTATED RINGERS IV SOLN: 500.0000 mL | INTRAVENOUS | Status: DC | PRN

## 2021-11-02 MED ORDER — OXYTOCIN BOLUS FROM INFUSION
333.0000 mL | Freq: Once | INTRAVENOUS | Status: AC
  Administered 2021-11-02: 333 mL via INTRAVENOUS

## 2021-11-02 MED ORDER — COCONUT OIL OIL: 1.0000 | TOPICAL_OIL | Status: DC | PRN

## 2021-11-02 MED ORDER — LACTATED RINGERS IV SOLN: INTRAVENOUS | Status: DC

## 2021-11-02 MED ORDER — ACETAMINOPHEN 325 MG PO TABS
650.0000 mg | ORAL_TABLET | ORAL | Status: DC | PRN
  Administered 2021-11-03: 650 mg via ORAL
  Filled 2021-11-02: qty 2

## 2021-11-02 MED ORDER — DIBUCAINE (PERIANAL) 1 % EX OINT: 1.0000 | TOPICAL_OINTMENT | CUTANEOUS | Status: DC | PRN

## 2021-11-02 MED ORDER — LACTATED RINGERS IV SOLN
500.0000 mL | Freq: Once | INTRAVENOUS | Status: AC
  Administered 2021-11-02: 500 mL via INTRAVENOUS

## 2021-11-02 MED ORDER — LIDOCAINE HCL (PF) 1 % IJ SOLN: 30.0000 mL | INTRAMUSCULAR | Status: DC | PRN

## 2021-11-02 MED ORDER — TETANUS-DIPHTH-ACELL PERTUSSIS 5-2.5-18.5 LF-MCG/0.5 IM SUSY: 0.5000 mL | PREFILLED_SYRINGE | Freq: Once | INTRAMUSCULAR | Status: DC

## 2021-11-02 MED ORDER — SENNOSIDES-DOCUSATE SODIUM 8.6-50 MG PO TABS
2.0000 | ORAL_TABLET | Freq: Every day | ORAL | Status: DC
  Filled 2021-11-02 (×2): qty 2

## 2021-11-02 MED ORDER — WITCH HAZEL-GLYCERIN EX PADS: 1.0000 | MEDICATED_PAD | CUTANEOUS | Status: DC | PRN

## 2021-11-02 MED ORDER — OXYTOCIN-SODIUM CHLORIDE 30-0.9 UT/500ML-% IV SOLN
1.0000 m[IU]/min | INTRAVENOUS | Status: DC
  Administered 2021-11-02: 2 m[IU]/min via INTRAVENOUS

## 2021-11-02 MED ORDER — LIDOCAINE-EPINEPHRINE (PF) 2 %-1:200000 IJ SOLN
INTRAMUSCULAR | Status: DC | PRN
  Administered 2021-11-02: 5 mL via EPIDURAL

## 2021-11-02 MED ORDER — ONDANSETRON HCL 4 MG PO TABS: 4.0000 mg | ORAL_TABLET | ORAL | Status: DC | PRN

## 2021-11-02 MED ORDER — MISOPROSTOL 50MCG HALF TABLET
50.0000 ug | ORAL_TABLET | ORAL | Status: DC | PRN
  Administered 2021-11-02: 50 ug via BUCCAL
  Filled 2021-11-02: qty 1

## 2021-11-02 MED ORDER — CEFAZOLIN SODIUM-DEXTROSE 2-4 GM/100ML-% IV SOLN
2.0000 g | Freq: Once | INTRAVENOUS | Status: AC
  Administered 2021-11-02: 2 g via INTRAVENOUS
  Filled 2021-11-02: qty 100

## 2021-11-02 MED ORDER — CEFAZOLIN SODIUM-DEXTROSE 1-4 GM/50ML-% IV SOLN: 1.0000 g | Freq: Three times a day (TID) | INTRAVENOUS | Status: DC

## 2021-11-02 MED ORDER — IBUPROFEN 600 MG PO TABS
600.0000 mg | ORAL_TABLET | Freq: Four times a day (QID) | ORAL | Status: DC
  Administered 2021-11-02 – 2021-11-04 (×6): 600 mg via ORAL
  Filled 2021-11-02 (×7): qty 1

## 2021-11-02 MED ORDER — DIPHENHYDRAMINE HCL 25 MG PO CAPS: 25.0000 mg | ORAL_CAPSULE | Freq: Four times a day (QID) | ORAL | Status: DC | PRN

## 2021-11-02 MED ORDER — SIMETHICONE 80 MG PO CHEW: 80.0000 mg | CHEWABLE_TABLET | ORAL | Status: DC | PRN

## 2021-11-02 MED ORDER — SOD CITRATE-CITRIC ACID 500-334 MG/5ML PO SOLN: 30.0000 mL | ORAL | Status: DC | PRN

## 2021-11-02 MED ORDER — ONDANSETRON HCL 4 MG/2ML IJ SOLN: 4.0000 mg | Freq: Four times a day (QID) | INTRAMUSCULAR | Status: DC | PRN

## 2021-11-02 MED ORDER — ZOLPIDEM TARTRATE 5 MG PO TABS: 5.0000 mg | ORAL_TABLET | Freq: Every evening | ORAL | Status: DC | PRN

## 2021-11-02 NOTE — Anesthesia Preprocedure Evaluation (Signed)
Anesthesia Evaluation  Patient identified by MRN, date of birth, ID band Patient awake    Reviewed: Allergy & Precautions, NPO status , Patient's Chart, lab work & pertinent test results  Airway Mallampati: III  TM Distance: >3 FB Neck ROM: Full    Dental no notable dental hx.    Pulmonary asthma ,    Pulmonary exam normal breath sounds clear to auscultation       Cardiovascular negative cardio ROS Normal cardiovascular exam Rhythm:Regular Rate:Normal     Neuro/Psych PSYCHIATRIC DISORDERS Anxiety Depression negative neurological ROS     GI/Hepatic negative GI ROS, Neg liver ROS,   Endo/Other  Morbid obesity (BMI 45)  Renal/GU negative Renal ROS  negative genitourinary   Musculoskeletal negative musculoskeletal ROS (+)   Abdominal   Peds  Hematology negative hematology ROS (+)   Anesthesia Other Findings Presents with SROM  Reproductive/Obstetrics (+) Pregnancy                             Anesthesia Physical Anesthesia Plan  ASA: 3  Anesthesia Plan: Epidural   Post-op Pain Management:    Induction:   PONV Risk Score and Plan: Treatment may vary due to age or medical condition  Airway Management Planned: Natural Airway  Additional Equipment:   Intra-op Plan:   Post-operative Plan:   Informed Consent: I have reviewed the patients History and Physical, chart, labs and discussed the procedure including the risks, benefits and alternatives for the proposed anesthesia with the patient or authorized representative who has indicated his/her understanding and acceptance.       Plan Discussed with: Anesthesiologist  Anesthesia Plan Comments: (Patient identified. Risks, benefits, options discussed with patient including but not limited to bleeding, infection, nerve damage, paralysis, failed block, incomplete pain control, headache, blood pressure changes, nausea, vomiting,  reactions to medication, itching, and post partum back pain. Confirmed with bedside nurse the patient's most recent platelet count. Confirmed with the patient that they are not taking any anticoagulation, have any bleeding history or any family history of bleeding disorders. Patient expressed understanding and wishes to proceed. All questions were answered. )        Anesthesia Quick Evaluation

## 2021-11-02 NOTE — Discharge Summary (Shared)
Postpartum Discharge Summary  Date of Service updated***     Patient Name: Barbara Daniels DOB: Nov 04, 1995 MRN: [id number]  Date of admission: 11/02/2021 Delivery date:11/02/2021  Delivering provider: Deloris Ping  Date of discharge: 11/02/2021  Admitting diagnosis: Pregnancy [Z34.90] Intrauterine pregnancy: [redacted]w[redacted]d    Secondary diagnosis:  Principal Problem:   Pregnancy  Additional problems: ***    Discharge diagnosis: {DX.:23714}                                              Post partum procedures:{Postpartum procedures:23558} Augmentation: Pitocin Complications: None  Hospital course: Onset of Labor With Vaginal Delivery      26y.o. yo G1P0101 at 377w6das admitted in Latent Labor on 11/02/2021. Patient had an uncomplicated labor course as follows:  Membrane Rupture Time/Date: 1:30 AM ,11/02/2021   Delivery Method:Vaginal, Spontaneous  Episiotomy: None  Lacerations:  Periurethral;Labial  Patient had an uncomplicated postpartum course.  She is ambulating, tolerating a regular diet, passing flatus, and urinating well. Patient is discharged home in stable condition on 11/02/21.  Newborn Data: Birth date:11/02/2021  Birth time:6:54 PM  Gender:Female  Living status:Living  Apgars:8 ,9  Weight:2960 g   Magnesium Sulfate received: {Mag received:30440022} BMZ received: {BMZ received:30440023} Rhophylac:{Rhophylac received:30440032} MMEBX:{IDH:68616837}-DaP:{Tdap:23962} Flu: {F{GBM:21115}ransfusion:{Transfusion received:30440034}  Physical exam  Vitals:   11/02/21 1946 11/02/21 2000 11/02/21 2015 11/02/21 2031  BP: 123/72 127/79 (!) 113/57 121/66  Pulse: 68 65 66 60  Resp:      Temp:      TempSrc:      SpO2:      Weight:      Height:       General: {Exam; general:21111117} Lochia: {Desc; appropriate/inappropriate:30686::"appropriate"} Uterine Fundus: {Desc; firm/soft:30687} Incision: {Exam; incision:21111123} DVT Evaluation: {Exam; dvt:2111122} Labs: Lab  Results  Component Value Date   WBC 8.2 11/02/2021   HGB 13.8 11/02/2021   HCT 38.9 11/02/2021   MCV 87.2 11/02/2021   PLT 248 11/02/2021      Latest Ref Rng & Units 08/30/2021   10:22 AM  CMP  Glucose 70 - 99 mg/dL 127   BUN 6 - 20 mg/dL 6   Creatinine 0.57 - 1.00 mg/dL 0.63   Sodium 134 - 144 mmol/L 136   Potassium 3.5 - 5.2 mmol/L 3.7   Chloride 96 - 106 mmol/L 103   CO2 20 - 29 mmol/L 19   Calcium 8.7 - 10.2 mg/dL 9.0   Total Protein 6.0 - 8.5 g/dL 6.5   Total Bilirubin 0.0 - 1.2 mg/dL <0.2   Alkaline Phos 44 - 121 IU/L 62   AST 0 - 40 IU/L 15   ALT 0 - 32 IU/L 22    Edinburgh Score:     No data to display           After visit meds:  Allergies as of 11/02/2021       Reactions   Penicillins Hives     Med Rec must be completed prior to using this [facility]        Discharge home in stable condition Infant Feeding: {Baby feeding:23562} Infant Disposition:{CHL IP OB HOME WITH MOZMCEYE:23361}ischarge instruction: per After Visit Summary and Postpartum booklet. Activity: Advance as tolerated. Pelvic rest for 6 weeks.  Diet: {OB diQAES:97530051}uture Appointments: Future Appointments  Date Time Provider [place]/2023  7:30 AM [facility] NURSE [facility]  11/05/2021  7:45 AM [facility] US5 [facility]  11/06/2021  9:35 AM Clarnce Flock, MD [facility]  11/11/2021  9:45 AM [facility] NURSE [facility]  11/11/2021 10:00 AM [facility] US1 [facility]  11/13/2021  8:15 AM Clarnce Flock, MD [facility]  11/18/2021 10:30 AM [facility] US2 [facility]  11/21/2021  3:35 PM Caren Macadam, MD [facility]  11/25/2021  8:35 AM Donnamae Jude, MD [facility]  11/25/2021  9:15 AM [facility] NST [facility] [place]   Follow up Visit:   Please schedule this patient for a Virtual postpartum visit in 6 weeks with the following provider: Any provider. Additional Postpartum F/U: n/a   Low risk pregnancy complicated by:  n/a  Delivery mode:  Vaginal,  Spontaneous  Anticipated Birth Control:  POPs  Message sent to [facility] on 11/02/21 11/02/2021 Jacquiline Doe, CNM

## 2021-11-02 NOTE — MAU Provider Note (Signed)
S: Ms. MIRZA KIDNEY is a 26 y.o. G1P0 at [redacted]w[redacted]d  who presents to MAU today complaining of leaking of fluid since 0130. She denies vaginal bleeding. She denies contractions. She reports normal fetal movement.    O: BP 127/78   Pulse 79   Temp 98.5 F (36.9 C) (Oral)   Resp 16   Ht 5\' 9"  (1.753 m)   Wt (!) 137.8 kg   LMP 02/17/2021   SpO2 98% Comment: room air  BMI 44.88 kg/m  GENERAL: Well-developed, well-nourished female in no acute distress.  HEAD: Normocephalic, atraumatic.  CHEST: Normal effort of breathing, regular heart rate ABDOMEN: Soft, nontender, gravid PELVIC: deferred, grossly ruptured  Cervical exam: deferred  Fetal Monitoring: Baseline: 125 Variability: moderate Accelerations: 15x15 Decelerations: none Contractions: none  Results for orders placed or performed during the hospital encounter of 11/02/21 (from the past 24 hour(s))  Fern Test     Status: None   Collection Time: 11/02/21  8:30 AM  Result Value Ref Range   POCT Fern Test Positive = ruptured amniotic membanes   Type and screen [facility]     Status: None   Collection Time: 11/02/21  8:40 AM  Result Value Ref Range   ABO/RH(D) O POS    Antibody Screen NEG    Sample Expiration      11/05/2021,2359 Performed at [facility], [address], [place], [place]   CBC     Status: None   Collection Time: 11/02/21  8:42 AM  Result Value Ref Range   WBC 8.2 4.0 - 10.5 K/uL   RBC 4.46 3.87 - 5.11 MIL/uL   Hemoglobin 13.8 12.0 - 15.0 g/dL   HCT 11/04/21 84.6 - 96.2 %   MCV 87.2 80.0 - 100.0 fL   MCH 30.9 26.0 - 34.0 pg   MCHC 35.5 30.0 - 36.0 g/dL   RDW 95.2 84.1 - 32.4 %   Platelets 248 150 - 400 K/uL   nRBC 0.0 0.0 - 0.2 %   Pt informed that the ultrasound is considered a limited OB ultrasound and is not intended to be a complete ultrasound exam.  Patient also informed that the ultrasound is not being completed with the intent of assessing for fetal or placental  anomalies or any pelvic abnormalities.  Explained that the purpose of today's ultrasound is to assess for  presentation.  Patient acknowledges the purpose of the exam and the limitations of the study.    Vertex presentation confirmed.   CNM discussed with patient that give her preterm status, she is not a candidate for WB at this time but if she does not deliver today, she could be considered starting tomorrow. Patient emphatically states she does not want a waterbirth.   CNM consulted with Dr. 40.1 from NICU regarding preterm status. OK to admit patient but caution patient that if infant needs NICU care, they will be transferred out post delivery.    A: SIUP at [redacted]w[redacted]d  SROM  P: Report called to labor team  [redacted]w[redacted]d, CNM 11/02/2021 8:34 AM

## 2021-11-02 NOTE — MAU Note (Signed)
Barbara Daniels is a 26 y.o. at [redacted]w[redacted]d here in MAU reporting: this AM around 0130 she felt a lot of fluid of fluid on the bed and it kept dripping down her leg. Fluid is clear. About an hour ago saw a string of blood and a small clot. No pain. No recent IC.  Onset of complaint: today  Pain score: 0/10  Vitals:   11/02/21 0815  BP: 125/75  Pulse: 73  Resp: 16  Temp: 98.7 F (37.1 C)  SpO2: 98%     FHT:125  Lab orders placed from triage: none

## 2021-11-02 NOTE — Progress Notes (Signed)
Barbara Daniels is a 26 y.o. G1P0 at [redacted]w[redacted]d by ultrasound admitted for rupture of membranes.   Subjective: Patient becoming uncomfortable with contractions requesting an epidural.   Objective: BP 124/65   Pulse 76   Temp (!) 97.4 F (36.3 C) (Axillary)   Resp 18   Ht 5\' 9"  (1.753 m)   Wt (!) 137.8 kg   LMP 02/17/2021   SpO2 100%   BMI 44.88 kg/m  No intake/output data recorded. No intake/output data recorded.  FHT:  FHR: 130 bpm, variability: moderate,  accelerations:  Present,  decelerations:  Absent UC:   regular, every 3-4 minutes SVE:   Dilation: 2.5 Effacement (%): 80 Station: -3 Exam by:: [email or web address], RN  Labs: Lab Results  Component Value Date   WBC 8.2 11/02/2021   HGB 13.8 11/02/2021   HCT 38.9 11/02/2021   MCV 87.2 11/02/2021   PLT 248 11/02/2021    Assessment / Plan: Spontaneous labor with SROM clear fluid.    Labor:  Progressing slowly. Plan for Pit 2x2 and titrate appropriately as needed.  Fetal Wellbeing:  Category I- Continue to monitor for signs of fetal distress.  Pain Control:  Epidural I/D:   GBS Positive - Ancef Anticipated MOD:  NSVD  11/04/2021, CNM 11/02/2021, 2:49 PM

## 2021-11-02 NOTE — H&P (Signed)
Barbara Daniels is a 26 y.o. female presenting for Spontaneous ROM @ ~0200 am. Clear Fluid noted. OB History     Gravida  1   Para      Term      Preterm      AB      Living         SAB      IAB      Ectopic      Multiple      Live Births             Past Medical History:  Diagnosis Date   Anxiety    Asthma    Depression    History of chickenpox    Hyperlipidemia    Past Surgical History:  Procedure Laterality Date   WISDOM TOOTH EXTRACTION     Family History: family history includes Arthritis in her paternal grandmother; Diabetes in her father and maternal grandmother; Heart failure in her paternal grandfather; Hypertension in her father. Social History:  reports that she has never smoked. She has never used smokeless tobacco. She reports that she does not drink alcohol and does not use drugs.     Maternal Diabetes: No Genetic Screening: Normal Maternal Ultrasounds/Referrals: Normal Fetal Ultrasounds or other Referrals:  None Maternal Substance Abuse:  No Significant Maternal Medications:  None Significant Maternal Lab Results:  Group B Strep positive Other Comments:   BPP on 7/17 revealed Cephalic presentation with Anterior placenta. 8/8.  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   Review of Systems  Constitutional:  Negative for appetite change, chills, fatigue and fever.  Respiratory:  Negative for shortness of breath and wheezing.   Cardiovascular:  Negative for chest pain.  Gastrointestinal:  Negative for abdominal pain, constipation, diarrhea, nausea and vomiting.  Genitourinary:  Negative for vaginal bleeding, vaginal discharge and vaginal pain.  All other systems reviewed and are negative.  Maternal Medical History:  Reason for admission: Rupture of membranes.  Nausea.  Contractions: Frequency: irregular.   Fetal activity: Perceived fetal activity is normal.     Dilation: 2 Effacement (%): 50 Station:  Ballotable Exam by:: Verdie Drown, RN Blood pressure 122/74, pulse 68, temperature 98.5 F (36.9 C), temperature source Oral, resp. rate 18, height 5\' 9"  (1.753 m), weight (!) 137.8 kg, last menstrual period 02/17/2021, SpO2 98 %. Maternal Exam:  Uterine Assessment: Contraction strength is mild.  Contraction frequency is irregular.  Abdomen: Patient reports no abdominal tenderness. Fetal presentation: vertex   Fetal Exam Fetal Monitor Review: Mode: ultrasound.   Baseline rate: 125.  Variability: moderate (6-25 bpm).   Pattern: accelerations present and no decelerations.   Fetal State Assessment: Category I - tracings are normal.   Physical Exam Vitals and nursing note reviewed.  Constitutional:      General: She is not in acute distress.    Appearance: Normal appearance.  HENT:     Head: Normocephalic.  Pulmonary:     Effort: Pulmonary effort is normal.  Musculoskeletal:     Cervical back: Normal range of motion.  Skin:    General: Skin is warm and dry.  Neurological:     Mental Status: She is alert and oriented to person, place, and time.  Psychiatric:        Mood and Affect: Mood normal.     Prenatal labs: ABO, Rh: --/--/O POS (07/22 0840) Antibody: NEG (07/22 0840) Rubella: 2.80 (02/22 1511) RPR: Non Reactive (05/19 0840)  HBsAg: Negative (  02/22 1511)  HIV: Non Reactive (05/19 0840)  GBS: Positive/-- (07/19 1150)   Assessment/Plan: - SROM clear fluid @ 0200. Patient not contracting.  - Labor: Plan for buccal cytotec. Reassess in 4 hours.  - Fetal Status: Cat I- Continue to monitor for signs of distress.  - ID: GBS Positive PCN ALLERGY- IV Ancef for prophylaxis.  - MOD: NSVD     Claudette Head, MSN CNM  11/02/2021, 11:37 AM

## 2021-11-02 NOTE — Anesthesia Procedure Notes (Signed)
Epidural Patient location during procedure: OB Start time: 11/02/2021 2:30 PM End time: 11/02/2021 2:40 PM  Staffing Anesthesiologist: Elmer Picker, MD Performed: anesthesiologist   Preanesthetic Checklist Completed: patient identified, IV checked, risks and benefits discussed, monitors and equipment checked, pre-op evaluation and timeout performed  Epidural Patient position: sitting Prep: DuraPrep and site prepped and draped Patient monitoring: continuous pulse ox, blood pressure, heart rate and cardiac monitor Approach: midline Location: L3-L4 Injection technique: LOR air  Needle:  Needle type: Tuohy  Needle gauge: 17 G Needle length: 9 cm Needle insertion depth: 7 cm Catheter type: closed end flexible Catheter size: 19 Gauge Catheter at skin depth: 12 cm Test dose: negative  Assessment Sensory level: T8 Events: blood not aspirated, injection not painful, no injection resistance, no paresthesia and negative IV test  Additional Notes Patient identified. Risks/Benefits/Options discussed with patient including but not limited to bleeding, infection, nerve damage, paralysis, failed block, incomplete pain control, headache, blood pressure changes, nausea, vomiting, reactions to medication both or allergic, itching and postpartum back pain. Confirmed with bedside nurse the patient's most recent platelet count. Confirmed with patient that they are not currently taking any anticoagulation, have any bleeding history or any family history of bleeding disorders. Patient expressed understanding and wished to proceed. All questions were answered. Sterile technique was used throughout the entire procedure. Please see nursing notes for vital signs. Test dose was given through epidural catheter and negative prior to continuing to dose epidural or start infusion. Warning signs of high block given to the patient including shortness of breath, tingling/numbness in hands, complete motor block,  or any concerning symptoms with instructions to call for help. Patient was given instructions on fall risk and not to get out of bed. All questions and concerns addressed with instructions to call with any issues or inadequate analgesia.  Reason for block:procedure for pain

## 2021-11-03 LAB — CBC
HCT: 35.8 % — ABNORMAL LOW (ref 36.0–46.0)
Hemoglobin: 12.7 g/dL (ref 12.0–15.0)
MCH: 31.1 pg (ref 26.0–34.0)
MCHC: 35.5 g/dL (ref 30.0–36.0)
MCV: 87.5 fL (ref 80.0–100.0)
Platelets: 224 10*3/uL (ref 150–400)
RBC: 4.09 MIL/uL (ref 3.87–5.11)
RDW: 13.2 % (ref 11.5–15.5)
WBC: 10.5 10*3/uL (ref 4.0–10.5)
nRBC: 0 % (ref 0.0–0.2)

## 2021-11-03 LAB — RPR: RPR Ser Ql: NONREACTIVE

## 2021-11-03 NOTE — Lactation Note (Signed)
This note was copied from a baby's chart. Lactation Consultation Note  Patient Name: Barbara Daniels NTIRW'E Date: 11/03/2021 Reason for consult: Follow-up assessment;Mother's request;Difficult latch;Primapara;1st time breastfeeding;Early term 37-38.6wks;Infant weight loss;Breastfeeding assistance (2.36% WL) Age:24 hours  P1, Early Term, Infant Female, 2.36% WL  Infant at 78 hours old. LC called to the room at [facility] request. Per mom, she has not been able to express much milk with the pump or through hand expression. Mom states that the night time LC was able to express milk via hand expression, but she has not been able to do the same. [facility] taught mom how to hand express and she was able to express 16mL from the right breast.   Mom also used the DEBP. Mom states that she has been using the pump on a 5 setting, per the recommendations of the night time LC. Mom states that a 5 is too painful. LC encouraged mom to try pumping at a 3. Mom states that the 3 is much better.   LC reviewed milk production in the early days, supply and demand, and pumping frequency.   Mom states that she will pump q3hrs.   Mom will call RN/LC for breastfeeding assistance.    Interventions Interventions: Breast feeding basics reviewed;Hand express;Expressed milk;Education  Discharge Pump:  (Stork Pump for left with mom)  Consult Status Consult Status: Follow-up Date: 11/04/21 Follow-up type: In-patient    Delene Loll 11/03/2021, 10:54 AM

## 2021-11-03 NOTE — Progress Notes (Signed)
POSTPARTUM PROGRESS NOTE  Post Partum Day 1  Subjective:  Barbara Daniels is a 26 y.o. G1P0101 s/p SVD at [redacted]w[redacted]d.  She reports she is doing well. No acute events overnight. She denies any problems with ambulating, voiding or po intake. Denies nausea or vomiting.  Pain is well controlled.  Lochia is appropriate.  Objective: Blood pressure 116/67, pulse 73, temperature 98.3 F (36.8 C), temperature source Oral, resp. rate 18, height 5\' 9"  (1.753 m), weight (!) 137.8 kg, last menstrual period 02/17/2021, SpO2 96 %, unknown if currently breastfeeding.  Physical Exam:  General: alert, cooperative and no distress Chest: no respiratory distress Heart:regular rate, distal pulses intact Abdomen: soft, nontender,  Uterine Fundus: firm, appropriately tender DVT Evaluation: No calf swelling or tenderness Extremities: no edema Skin: warm, dry  Recent Labs    11/02/21 0842 11/03/21 0533  HGB 13.8 12.7  HCT 38.9 35.8*    Assessment/Plan: Barbara Daniels is a 27 y.o. G1P0101 s/p SVD at [redacted]w[redacted]d   PPD#1 - Doing well  Routine postpartum care  Contraception: POPs Feeding: breast and formula Dispo: Plan for discharge tomorrow.   LOS: 1 day   [redacted]w[redacted]d, MD  [facility]  11/03/2021, 12:01 PM

## 2021-11-03 NOTE — Lactation Note (Signed)
This note was copied from a baby's chart. Lactation Consultation Note  Patient Name: Barbara Daniels Date: 11/03/2021 Reason for consult: Follow-up assessment;Mother's request;Difficult latch;Late-preterm 34-36.6wks;Breastfeeding assistance Age:24 hours  LC called to assist with latching. Infant at first sleepy at the breast, fed 5 ml of formula and latched in cross cradle prone for 6 mins. Infant hard time to sustain the latch.   Mom aware to keep  total feeding under 30 mins.   Plan 1. To feed based on cues 8-12x 24hr period. If sleepy, give some colostrum then try a latch.  2. Mom to supplement with EBM first followed by formula with pace bottle feeding and slow flow nipple.  3. Post pump after each feeding for 15 mins.   All questions answered at the end of the visit.   Maternal Data Has patient been taught Hand Expression?: Yes  Feeding Mother's Current Feeding Choice: Breast Milk and Formula Nipple Type: Slow - flow  LATCH Score Latch: Repeated attempts needed to sustain latch, nipple held in mouth throughout feeding, stimulation needed to elicit sucking reflex.  Audible Swallowing: A few with stimulation  Type of Nipple: Everted at rest and after stimulation  Comfort (Breast/Nipple): Soft / non-tender  Hold (Positioning): Assistance needed to correctly position infant at breast and maintain latch.  LATCH Score: 7   Lactation Tools Discussed/Used Tools: Flanges;Pump Flange Size: 24;27 Breast pump type: Double-Electric Breast Pump (LC adjusted flange size to 24 and 27. Mom aware with pumping nipple size can change to adjust accordingly.) Pump Education: Setup, frequency, and cleaning;Milk Storage Reason for Pumping: increase stimulation Pumping frequency: post pump after each feeding for 15 mins  Interventions Interventions: Breast feeding basics reviewed;Assisted with latch;Skin to skin;Breast massage;Hand express;Breast compression;Adjust  position;Support pillows;Position options;Expressed milk;DEBP;Education;Pace feeding;LC Services brochure;LPT handout/interventions  Discharge    Consult Status Consult Status: Follow-up Date: 11/04/21 Follow-up type: In-patient    Lucciana Head  Nicholson-Springer 11/03/2021, 5:53 PM

## 2021-11-03 NOTE — Anesthesia Postprocedure Evaluation (Signed)
Anesthesia Post Note  Patient: Barbara Daniels  Procedure(s) Performed: AN AD HOC LABOR EPIDURAL     Patient location during evaluation: Mother Baby Anesthesia Type: Epidural Level of consciousness: awake and alert Pain management: pain level controlled Vital Signs Assessment: post-procedure vital signs reviewed and stable Respiratory status: spontaneous breathing, nonlabored ventilation and respiratory function stable Cardiovascular status: stable Postop Assessment: no headache, no backache and epidural receding Anesthetic complications: no   No notable events documented.  Last Vitals:  Vitals:   11/03/21 0123 11/03/21 0531  BP: 119/73 113/78  Pulse: 70 70  Resp: 16 16  Temp: 36.7 C 36.7 C  SpO2: 100% 100%    Last Pain:  Vitals:   11/03/21 0531  TempSrc: Oral  PainSc: 0-No pain   Pain Goal:                   Makita Blow

## 2021-11-03 NOTE — Lactation Note (Signed)
This note was copied from a baby's chart. Lactation Consultation Note  Patient Name: Barbara Daniels LOVFI'E Date: 11/03/2021 Reason for consult: Initial assessment;Mother's request;1st time breastfeeding;Late-preterm 34-36.6wks Age:24 hours P1, LPTI Mom's feeding choice is breast and formula feeding. Per mom, infant latched well in L&D, mom started supplementing infant with formula, stopped breastfeeding infant because she thought she did not have colostrum.  LC unable to assist with latch due mom giving infant 5 mls of formula prior to [facility] entering the room and infant was not cuing to feed. LC discussed hand expression using breast model and mom self express colostrum and was excited that she has colostrum. Mom was set up with DEBP, mom was fitted  with 27 mm breast flange and was expressing colostrum when using DEBP while LC was in the room, mom had expressed 7 mls and was still pumping when LC left the room. Mom plans to latch infant at the next feeding and afterwards give infant the EBM that she pumped, she will follow LPTI supplemental guidelines ( green sheet given).  Mom knows that her EBM is good for 4 hours at room temperature whereas formula must be used within 1 hour. Mom made aware of O/P services, breastfeeding support groups, community resources, and our phone # for post-discharge questions.   Mom's current plan: 1- Mom will follow LPTI feeding policy, mom plans to latch infant for every feeding first, BF by cues 8 to 12+ times within 24 hours, limit total feedings to 30 minutes or less. 2- Mom will offer infant her EBM first after latching infant at the breast before supplementing infant with formula. 3-Mom will ask RN/LC for latch assistance if needed. Maternal Data    Feeding Mother's Current Feeding Choice: Breast Milk and Formula Nipple Type: Slow - flow  LATCH Score                    Lactation Tools Discussed/Used Tools: Pump;Flanges Flange Size:  27 Breast pump type: Double-Electric Breast Pump Pump Education: Setup, frequency, and cleaning;Milk Storage Reason for Pumping: LPTI Pumping frequency: Mom will continue to pump every 3 hours for 15 minutes on inital setting. Pumped volume: 7 mL (Mom was still expressing colostrum when LC left the room.)  Interventions Interventions: Skin to skin;Breast feeding basics reviewed;Breast compression;Expressed milk;Hand express;DEBP;LC Services brochure;LPT handout/interventions  Discharge    Consult Status Consult Status: Follow-up Date: 11/03/21 Follow-up type: In-patient    Barbara Daniels 11/03/2021, 1:54 AM

## 2021-11-03 NOTE — Progress Notes (Signed)
Circumcision Consent  Discussed with mom at bedside about circumcision.   Circumcision is a surgery that removes the skin that covers the tip of the penis, called the "foreskin." Circumcision is usually done when a boy is between 40 and 17 days old, sometimes up to 23-42 weeks old.  The most common reasons boys are circumcised include for cultural/religious beliefs or for parental preference (potentially easier to clean, so baby looks like daddy, etc).  There may be some medical benefits for circumcision:   Circumcised boys seem to have slightly lower rates of: ? Urinary tract infections (per the American Academy of Pediatrics an uncircumcised boy has a 1/100 chance of developing a UTI in the first year of life, a circumcised boy at a 04/998 chance of developing a UTI in the first year of life- a 10% reduction) ? Penis cancer (typically rare- an uncircumcised female has a 1 in 100,000 chance of developing cancer of the penis) ? Sexually transmitted infection (in endemic areas, including HIV, HPV and Herpes- circumcision does NOT protect against gonorrhea, chlamydia, trachomatis, or syphilis) ? Phimosis: a condition where that makes retraction of the foreskin over the glans impossible (0.4 per 1000 boys per year or 0.6% of boys are affected by their 15th birthday)  Boys and men who are not circumcised can reduce these extra risks by: ? Cleaning their penis well ? Using condoms during sex  What are the risks of circumcision?  As with any surgical procedure, there are risks and complications. In circumcision, complications are rare and usually minor, the most common being: ? Bleeding- risk is reduced by holding each clamp for 30 seconds prior to a cut being made, and by holding pressure after the procedure is done ? Infection- the penis is cleaned prior to the procedure, and the procedure is done under sterile technique ? Damage to the urethra or amputation of the penis  How is circumcision done  in baby boys?  The baby will be placed on a special table and the legs restrained for their safety. Numbing medication is injected into the penis, and the skin is cleansed with betadine to decrease the risk of infection.   What to expect:  The penis will look red and raw for 5-7 days as it heals. We expect scabbing around where the cut was made, as well as clear-pink fluid and some swelling of the penis right after the procedure. If your baby's circumcision starts to bleed or develops pus, please contact your pediatrician immediately.  All questions were answered and mother consented.  Celedonio Savage, MD 12:04 PM
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
## 2021-11-04 ENCOUNTER — Other Ambulatory Visit (HOSPITAL_COMMUNITY): Payer: Self-pay

## 2021-11-04 MED ORDER — ACETAMINOPHEN 325 MG PO TABS
650.0000 mg | ORAL_TABLET | ORAL | 0 refills | Status: DC | PRN
  Filled 2021-11-04: qty 30, 3d supply, fill #0

## 2021-11-04 MED ORDER — IBUPROFEN 600 MG PO TABS
600.0000 mg | ORAL_TABLET | Freq: Four times a day (QID) | ORAL | 0 refills | Status: DC
  Filled 2021-11-04: qty 30, 8d supply, fill #0

## 2021-11-04 MED ORDER — NORETHINDRONE 0.35 MG PO TABS
1.0000 | ORAL_TABLET | Freq: Every day | ORAL | 11 refills | Status: DC
  Filled 2021-11-04: qty 28, 28d supply, fill #0

## 2021-11-04 NOTE — Social Work (Signed)
CSW received consult for "per overnight staff MOB needs a car seat." CSW met with MOB to offer support and complete assessment.    CSW met with MOB at bedside and introduced CSW role. CSW observed maternal grandmother holding the infant while MOB was hand expressing breast milk. CSW assessed MOB for needs. MOB reported that that she will make a WIC appointment when she and baby are home per WIC request. She completed the FS application and will submit it. MOB reported that she has a car seat for the infant and a bassinet for the infant to sleep. MOB reported that she can use assistance with diapers, wipes, NB clothes and soap. MOB reported that she currently has some of the items to be able to care for the infant for the next couple of weeks. Maternal grandmother express working extra hours to offer support as well. CSW informed MOB about [facility]. MOB gave CSW permission to make a referral. CSW assessed MOB for additional needs. MOB reported no further need.   Doroteo Nickolson, MSW, LCSW [facility]  Clinical Social Worker  [phone] 11/04/2021  12:35 PM  

## 2021-11-04 NOTE — Lactation Note (Signed)
This note was copied from a baby's chart. Lactation Consultation Note  Patient Name: Barbara Daniels [id number] Date: 11/04/2021 Reason for consult: Follow-up assessment;Difficult latch;Late-preterm 34-36.6wks;Other (Comment) (per mom recently fed the baby EBM and formula. Baby has a D/C and LC reviewed the BF D/C for LPT and extra pumping. per mom @ the Med center the Family center care she is involved in provides a LC and she plans to F/U.) Age:24 hours NP signed the form for the [facility] was accepted - and the rep [employer] called back to confirm and would delivered to the moms room.  LC called Beckie Salts [facility] ) to make her aware the DEBP would be delivered to the moms room and wait prior to D/C.  Maternal Data    Feeding Mother's Current Feeding Choice: Breast Milk and Formula  LATCH Score                    Lactation Tools Discussed/Used Tools: Pump;Flanges Flange Size: 24;27 Breast pump type: Double-Electric Breast Pump Pump Education: Milk Storage  Interventions Interventions: Breast feeding basics reviewed;Education;LC Services brochure  Discharge Discharge Education: Engorgement and breast care;Warning signs for feeding baby;Outpatient recommendation;Other (comment) (per mom will F/U) Pump: Stork Pump;DEBP WIC Program: Yes (per mom plans to call WIC - [place] to sign baby up)  Consult Status Consult Status: Complete Date: 11/04/21    Kathrin Greathouse 11/04/2021, 1:07 PM

## 2021-11-05 ENCOUNTER — Ambulatory Visit: Payer: Medicaid Other

## 2021-11-06 ENCOUNTER — Encounter: Payer: Self-pay | Admitting: Family Medicine

## 2021-11-06 ENCOUNTER — Other Ambulatory Visit (HOSPITAL_COMMUNITY): Payer: Self-pay

## 2021-11-09 ENCOUNTER — Telehealth (HOSPITAL_COMMUNITY): Payer: Self-pay

## 2021-11-09 NOTE — Telephone Encounter (Signed)
Patient reports feeling good. She states she is having some cramping in her lower abdomen and lower back pain that she takes motrin for. RN reviewed uterine involution process. RN also told patient that is common to have some soreness at epidural site after delivery. Patient declines questions/concerns about her health and healing.  Patient reports that baby is doing well. Eating, peeing/pooping, and gaining weight well. Baby sleeps in a crib or sleeper. RN reviewed ABC's of safe sleep with patient. Educated patient about safe sleep and SIDS prevention. Patient declines any questions or concerns about baby.  EPDS score is 9.  Barbara Daniels [facility]  11/09/21,1003

## 2021-11-11 ENCOUNTER — Ambulatory Visit: Payer: Medicaid Other

## 2021-11-13 ENCOUNTER — Encounter: Payer: Medicaid Other | Admitting: Family Medicine

## 2021-11-14 ENCOUNTER — Encounter: Payer: Medicaid Other | Admitting: Family Medicine

## 2021-11-18 ENCOUNTER — Ambulatory Visit: Payer: Medicaid Other

## 2021-11-21 ENCOUNTER — Encounter: Payer: Self-pay | Admitting: Family Medicine

## 2021-11-25 ENCOUNTER — Encounter: Payer: Self-pay | Admitting: Family Medicine

## 2021-11-25 ENCOUNTER — Other Ambulatory Visit: Payer: Self-pay

## 2021-12-05 ENCOUNTER — Ambulatory Visit: Payer: Self-pay | Admitting: Family Medicine

## 2021-12-06 ENCOUNTER — Encounter: Payer: Self-pay | Admitting: Family Medicine

## 2021-12-06 ENCOUNTER — Ambulatory Visit (INDEPENDENT_AMBULATORY_CARE_PROVIDER_SITE_OTHER): Payer: Medicaid Other | Admitting: Family Medicine

## 2021-12-06 DIAGNOSIS — Z30430 Encounter for insertion of intrauterine contraceptive device: Secondary | ICD-10-CM | POA: Diagnosis not present

## 2021-12-06 DIAGNOSIS — Z3480 Encounter for supervision of other normal pregnancy, unspecified trimester: Secondary | ICD-10-CM

## 2021-12-06 MED ORDER — LEVONORGESTREL 20.1 MCG/DAY IU IUD
1.0000 | INTRAUTERINE_SYSTEM | Freq: Once | INTRAUTERINE | Status: AC
  Administered 2021-12-06: 1 via INTRAUTERINE

## 2021-12-06 NOTE — Progress Notes (Signed)
    GYNECOLOGY OFFICE PROCEDURE NOTE  REKITA MIOTKE is a 26 y.o. G1P0101 here for [place] IUD insertion. No GYN concerns.  Last pap smear:  Lab Results  Component Value Date   DIAGPAP  06/05/2021    - Negative for intraepithelial lesion or malignancy (NILM)   HPVHIGH Negative 06/05/2021    Urine pregnancy test: negative  IUD Insertion Procedure Note Patient identified, informed consent performed, consent signed.   Discussed risks of irregular bleeding, increased cramping, infection, malpositioning or misplacement of the IUD outside the uterus which may require further procedure such as laparoscopy. Also discussed >99% contraception efficacy, increased risk of ectopic pregnancy with failure of method.  Time out was performed.  Speculum placed in the vagina.  Cervix visualized.  Cleaned with Betadine x 2.  Grasped anteriorly with a single tooth tenaculum.  Uterus sounded to 6.5 cm. IUD placed per manufacturer's recommendations.  Strings trimmed to 3 cm. Tenaculum was removed, good hemostasis noted.  Patient tolerated procedure well.   Patient was given post-procedure instructions.  She was advised to have backup contraception for one week.  Patient was also asked to check IUD strings periodically and follow up in 4 weeks for IUD check.  Venora Maples, MD/MPH Attending Family Medicine Physician, [facility] for [facility], [facility]

## 2021-12-06 NOTE — Progress Notes (Signed)
Post Partum Visit Note  Barbara Daniels is a 26 y.o. G4P0101 female who presents for a postpartum visit. She is 4 weeks postpartum following a normal spontaneous vaginal delivery.  I have fully reviewed the prenatal and intrapartum course. The delivery was at [redacted]w[redacted]d gestational weeks.  Anesthesia: epidural. Postpartum course has been uneventful. Baby is doing well. Baby is feeding by both breast and bottle - Similac Gentle . Bleeding staining only. Bowel function is normal. Bladder function is normal. Patient is not sexually active. Contraception method is IUD. Postpartum depression screening: negative.   The pregnancy intention screening data noted above was reviewed. Potential methods of contraception were discussed. The patient elected to proceed with No data recorded.   Edinburgh Postnatal Depression Scale - 12/06/21 1035       Edinburgh Postnatal Depression Scale:  In the Past 7 Days   I have been able to laugh and see the funny side of things. 0    I have looked forward with enjoyment to things. 0    I have blamed myself unnecessarily when things went wrong. 0    I have been anxious or worried for no good reason. 0    I have felt scared or panicky for no good reason. 0    Things have been getting on top of me. 1    I have been so unhappy that I have had difficulty sleeping. 0    I have felt sad or miserable. 1    I have been so unhappy that I have been crying. 0    The thought of harming myself has occurred to me. 0    Edinburgh Postnatal Depression Scale Total 2             Health Maintenance Due  Topic Date Due   HPV VACCINES (1 - 2-dose series) Never done   COVID-19 Vaccine (3 - Moderna series) 04/21/2020   INFLUENZA VACCINE  11/12/2021    The following portions of the patient's history were reviewed and updated as appropriate: allergies, current medications, past family history, past medical history, past social history, past surgical history, and problem  list.  Review of Systems Pertinent items noted in HPI and remainder of comprehensive ROS otherwise negative.  Objective:  BP 108/67   Pulse 71   Wt 274 lb (124.3 kg)   LMP 02/17/2021   Breastfeeding Yes   BMI 40.46 kg/m    General:  alert, cooperative, and appears stated age   Breasts:  not indicated  Lungs: Comfortalbe on room air  Wound N/a  GU exam:  normal        Assessment:   Normal postpartum exam.   Plan:   Essential components of care per ACOG recommendations:  1.  Mood and well being: Patient with negative depression screening today. Reviewed local resources for support.  - Patient tobacco use? No.   - hx of drug use? No.    2. Infant care and feeding:  -Patient currently breastmilk feeding? Yes. Reviewed importance of draining breast regularly to support lactation.  -Social determinants of health (SDOH) reviewed in EPIC. The following needs were identified: food insecurity, offered market  3. Sexuality, contraception and birth spacing - Patient does not want a pregnancy in the next year.  Desired family size is unsure number of children.  - Reviewed reproductive life planning. Reviewed contraceptive methods based on pt preferences and effectiveness.  Patient desired IUD or IUS today.  See procedure note - Discussed birth  spacing of 18 months  4. Sleep and fatigue -Encouraged family/partner/community support of 4 hrs of uninterrupted sleep to help with mood and fatigue  5. Physical Recovery  - Discussed patients delivery and complications. She describes her labor as good. - Patient had a Vaginal, no problems at delivery. Patient had a  periurethral  laceration. Perineal healing reviewed. Patient expressed understanding - Patient has urinary incontinence? No. - Patient is safe to resume physical and sexual activity  6.  Health Maintenance - HM due items addressed No - uptodate - Last pap smear  Diagnosis  Date Value Ref Range Status  06/05/2021    Corrected   - Negative for intraepithelial lesion or malignancy (NILM)   Pap smear not done at today's visit.  -Breast Cancer screening indicated? No.   7. Chronic Disease/Pregnancy Condition follow up: None  - PCP follow up  Venora Maples, MD Center for [facility], [facility]

## 2021-12-06 NOTE — Addendum Note (Signed)
Addended by: Isabell Jarvis on: 12/06/2021 12:34 PM   Modules accepted: Orders

## 2022-02-12 ENCOUNTER — Telehealth: Payer: Medicaid Other | Admitting: Physician Assistant

## 2022-02-12 DIAGNOSIS — A084 Viral intestinal infection, unspecified: Secondary | ICD-10-CM

## 2022-02-12 DIAGNOSIS — G5601 Carpal tunnel syndrome, right upper limb: Secondary | ICD-10-CM | POA: Diagnosis not present

## 2022-02-12 MED ORDER — ONDANSETRON 4 MG PO TBDP: 4.0000 mg | ORAL_TABLET | Freq: Three times a day (TID) | ORAL | 0 refills | Status: AC | PRN

## 2022-02-12 MED ORDER — NAPROXEN 500 MG PO TABS: 500.0000 mg | ORAL_TABLET | Freq: Two times a day (BID) | ORAL | 0 refills | Status: AC

## 2022-02-12 MED ORDER — LOPERAMIDE HCL 2 MG PO TABS: 2.0000 mg | ORAL_TABLET | Freq: Four times a day (QID) | ORAL | 0 refills | Status: AC | PRN

## 2022-02-12 NOTE — Progress Notes (Signed)
Virtual Visit Consent   Barbara Daniels, you are scheduled for a virtual visit with a [facility] provider today. Just as with appointments in the office, your consent must be obtained to participate. Your consent will be active for this visit and any virtual visit you may have with one of our providers in the next 365 days. If you have a MyChart account, a copy of this consent can be sent to you electronically.  As this is a virtual visit, video technology does not allow for your provider to perform a traditional examination. This may limit your provider's ability to fully assess your condition. If your provider identifies any concerns that need to be evaluated in person or the need to arrange testing (such as labs, EKG, etc.), we will make arrangements to do so. Although advances in technology are sophisticated, we cannot ensure that it will always work on either your end or our end. If the connection with a video visit is poor, the visit may have to be switched to a telephone visit. With either a video or telephone visit, we are not always able to ensure that we have a secure connection.  By engaging in this virtual visit, you consent to the provision of healthcare and authorize for your insurance to be billed (if applicable) for the services provided during this visit. Depending on your insurance coverage, you may receive a charge related to this service.  I need to obtain your verbal consent now. Are you willing to proceed with your visit today? Barbara Daniels has provided verbal consent on 02/12/2022 for a virtual visit (video or telephone). Margaretann Loveless, PA-C  Date: 02/12/2022 2:37 PM  Virtual Visit via Video Note   I, Margaretann Loveless, connected with  Barbara Daniels  ([id number], Apr 02, 1996) on 02/12/22 at  2:15 PM EDT by a video-enabled telemedicine application and verified that I am speaking with the correct person using two identifiers.  Location: Patient: Virtual Visit Location  Patient: Home Provider: Virtual Visit Location Provider: Home Office   I discussed the limitations of evaluation and management by telemedicine and the availability of in person appointments. The patient expressed understanding and agreed to proceed.    History of Present Illness: Barbara Daniels is a 26 y.o. who identifies as a female who was assigned female at birth, and is being seen today for nausea and vomiting. Also having numbness in her right arm and hand.  HPI: Emesis  This is a new problem. The current episode started yesterday. The problem occurs 2 to 4 times per day. The problem has been gradually worsening. The emesis has an appearance of stomach contents. There has been no fever. Associated symptoms include chills, diarrhea and myalgias. Pertinent negatives include no fever, sweats or weight loss. Risk factors include suspect food intake. She has tried increased fluids for the symptoms. The treatment provided no relief.    Also having numbness in the 1st-3rd fingertips on the right arm and has occasional pain that radiates into the forearm. Worse overnight. Has been on going for a few months. 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   Problems:  Patient Active Problem List   Diagnosis Date Noted   Pregnancy 11/02/2021   GAD (generalized anxiety disorder) 10/30/2016   Routine adult health maintenance 06/27/2016   Mild intermittent asthma 06/27/2016   Obesity affecting pregnancy 06/27/2016    Allergies:  Allergies  Allergen Reactions   Penicillins Hives   Medications:  Current Outpatient Medications:    loperamide (IMODIUM A-D) 2 MG tablet, Take 1 tablet (2 mg total) by mouth 4 (four) times daily as needed for diarrhea or loose stools., Disp: 30 tablet, Rfl: 0   naproxen (NAPROSYN) 500 MG tablet, Take 1 tablet (500 mg total) by mouth 2 (two) times daily with a meal., Disp: 30 tablet, Rfl: 0   ondansetron (ZOFRAN-ODT) 4 MG disintegrating tablet, Take  1 tablet (4 mg total) by mouth every 8 (eight) hours as needed for nausea or vomiting., Disp: 20 tablet, Rfl: 0   Prenatal Vit-Fe Fumarate-FA (PRENATAL VITAMIN) 27-0.8 MG TABS, Take 1 tablet by mouth daily., Disp: 30 tablet, Rfl: 11  Observations/Objective: Patient is well-developed, well-nourished in no acute distress.  Resting comfortably at home.  Head is normocephalic, atraumatic.  No labored breathing. Speech is clear and coherent with logical content.  Patient is alert and oriented at baseline.    Assessment and Plan: 1. Carpal tunnel syndrome of right wrist - naproxen (NAPROSYN) 500 MG tablet; Take 1 tablet (500 mg total) by mouth 2 (two) times daily with a meal.  Dispense: 30 tablet; Refill: 0  2. Viral gastroenteritis - ondansetron (ZOFRAN-ODT) 4 MG disintegrating tablet; Take 1 tablet (4 mg total) by mouth every 8 (eight) hours as needed for nausea or vomiting.  Dispense: 20 tablet; Refill: 0 - loperamide (IMODIUM A-D) 2 MG tablet; Take 1 tablet (2 mg total) by mouth 4 (four) times daily as needed for diarrhea or loose stools.  Dispense: 30 tablet; Refill: 0  - Suspect viral gastroenteritis - Zofran for nausea - Push fluids, electrolyte beverages - Liquid diet, then increase to soft/bland (BRAT) diet over next day, then increase diet as tolerated  - Carpal tunnel in right wrist suspected - Naproxen prescribed; avoiding systemic steroids due to breastfeeding - Discussed cock-up wrist splint for work and while sleeping - Ice after work  - Seek in person evaluation if not improving or symptoms worsen   Follow Up Instructions: I discussed the assessment and treatment plan with the patient. The patient was provided an opportunity to ask questions and all were answered. The patient agreed with the plan and demonstrated an understanding of the instructions.  A copy of instructions were sent to the patient via MyChart unless otherwise noted below.    The patient was advised to  call back or seek an in-person evaluation if the symptoms worsen or if the condition fails to improve as anticipated.  Time:  I spent 16 minutes with the patient via telehealth technology discussing the above problems/concerns.    Mar Daring, PA-C

## 2022-02-12 NOTE — Patient Instructions (Signed)
Geraldine Contras, thank you for joining Mar Daring, PA-C for today's virtual visit.  While this provider is not your primary care provider (PCP), if your PCP is located in our provider database this encounter information will be shared with them immediately following your visit.   [facility] account gives you access to today's visit and all your visits, tests, and labs performed at [facility] " click here if you don't have a [facility] account or go to [email or web address]  Consent: (Patient) Geraldine Contras provided verbal consent for this virtual visit at the beginning of the encounter.  Current Medications:  Current Outpatient Medications:    loperamide (IMODIUM A-D) 2 MG tablet, Take 1 tablet (2 mg total) by mouth 4 (four) times daily as needed for diarrhea or loose stools., Disp: 30 tablet, Rfl: 0   naproxen (NAPROSYN) 500 MG tablet, Take 1 tablet (500 mg total) by mouth 2 (two) times daily with a meal., Disp: 30 tablet, Rfl: 0   ondansetron (ZOFRAN-ODT) 4 MG disintegrating tablet, Take 1 tablet (4 mg total) by mouth every 8 (eight) hours as needed for nausea or vomiting., Disp: 20 tablet, Rfl: 0   Prenatal Vit-Fe Fumarate-FA (PRENATAL VITAMIN) 27-0.8 MG TABS, Take 1 tablet by mouth daily., Disp: 30 tablet, Rfl: 11   Medications ordered in this encounter:  Meds ordered this encounter  Medications   naproxen (NAPROSYN) 500 MG tablet    Sig: Take 1 tablet (500 mg total) by mouth 2 (two) times daily with a meal.    Dispense:  30 tablet    Refill:  0    Order Specific Question:   Supervising Provider    Answer:   Chase Picket [[id number]]   ondansetron (ZOFRAN-ODT) 4 MG disintegrating tablet    Sig: Take 1 tablet (4 mg total) by mouth every 8 (eight) hours as needed for nausea or vomiting.    Dispense:  20 tablet    Refill:  0    Order Specific Question:   Supervising Provider    Answer:   Chase Picket [id number]   loperamide (IMODIUM  A-D) 2 MG tablet    Sig: Take 1 tablet (2 mg total) by mouth 4 (four) times daily as needed for diarrhea or loose stools.    Dispense:  30 tablet    Refill:  0    Order Specific Question:   Supervising Provider    Answer:   Chase Picket [id number]     *If you need refills on other medications prior to your next appointment, please contact your pharmacy*  Follow-Up: Call back or seek an in-person evaluation if the symptoms worsen or if the condition fails to improve as anticipated.  [facility] [phone]  Other Instructions  Cock-up wrist splint  [facility] [address], [place], [place] [address] URGENT CARE HOURS Monday - Friday: 8:00am to 8:00pm Saturday: 10:00am to 3:00pm [phone]   Carpal Tunnel Syndrome  Carpal tunnel syndrome is a condition that causes pain, numbness, and weakness in your hand and fingers. The carpal tunnel is a narrow area located on the palm side of your wrist. Repeated wrist motion or certain diseases may cause swelling within the tunnel. This swelling pinches the main nerve in the wrist. The main nerve in the wrist is called the median nerve. What are the causes? This condition may be caused by: Repeated and forceful wrist and hand motions. Wrist injuries. Arthritis. A cyst or tumor in  the carpal tunnel. Fluid buildup during pregnancy. Use of tools that vibrate. Sometimes the cause of this condition is not known. What increases the risk? The following factors may make you more likely to develop this condition: Having a job that requires you to repeatedly or forcefully move your wrist or hand or requires you to use tools that vibrate. This may include jobs that involve using computers, working on an [employer], or working with power tools such as Radiographer, therapeutic. Being a woman. Having certain conditions, such as: Diabetes. Obesity. An underactive thyroid (hypothyroidism). Kidney  failure. Rheumatoid arthritis. What are the signs or symptoms? Symptoms of this condition include: A tingling feeling in your fingers, especially in your thumb, index, and middle fingers. Tingling or numbness in your hand. An aching feeling in your entire arm, especially when your wrist and elbow are bent for a long time. Wrist pain that goes up your arm to your shoulder. Pain that goes down into your palm or fingers. A weak feeling in your hands. You may have trouble grabbing and holding items. Your symptoms may feel worse during the night. How is this diagnosed? This condition is diagnosed with a medical history and physical exam. You may also have tests, including: Electromyogram (EMG). This test measures electrical signals sent by your nerves into the muscles. Nerve conduction study. This test measures how well electrical signals pass through your nerves. Imaging tests, such as X-rays, ultrasound, and MRI. These tests check for possible causes of your condition. How is this treated? This condition may be treated with: Lifestyle changes. It is important to stop or change the activity that caused your condition. Doing exercise and activities to strengthen and stretch your muscles and tendons (physical therapy). Making lifestyle changes to help with your condition and learning how to do your daily activities safely (occupational therapy). Medicines for pain and inflammation. This may include medicine that is injected into your wrist. A wrist splint or brace. Surgery. Follow these instructions at home: If you have a splint or brace: Wear the splint or brace as told by your health care provider. Remove it only as told by your health care provider. Loosen the splint or brace if your fingers tingle, become numb, or turn cold and blue. Keep the splint or brace clean. If the splint or brace is not waterproof: Do not let it get wet. Cover it with a watertight covering when you take a bath or  shower. Managing pain, stiffness, and swelling If directed, put ice on the painful area. To do this: If you have a removeable splint or brace, remove it as told by your health care provider. Put ice in a plastic bag. Place a towel between your skin and the bag or between the splint or brace and the bag. Leave the ice on for 20 minutes, 2-3 times a day. Do not fall asleep with the cold pack on your skin. Remove the ice if your skin turns bright red. This is very important. If you cannot feel pain, heat, or cold, you have a greater risk of damage to the area. Move your fingers often to reduce stiffness and swelling. General instructions Take over-the-counter and prescription medicines only as told by your health care provider. Rest your wrist and hand from any activity that may be causing your pain. If your condition is work related, talk with your employer about changes that can be made, such as getting a wrist pad to use while typing. Do any exercises as  told by your health care provider, physical therapist, or occupational therapist. Keep all follow-up visits. This is important. Contact a health care provider if: You have new symptoms. Your pain is not controlled with medicines. Your symptoms get worse. Get help right away if: You have severe numbness or tingling in your wrist or hand. Summary Carpal tunnel syndrome is a condition that causes pain, numbness, and weakness in your hand and fingers. It is usually caused by repeated wrist motions. Lifestyle changes and medicines are used to treat carpal tunnel syndrome. Surgery may be recommended. Follow your health care provider's instructions about wearing a splint, resting from activity, keeping follow-up visits, and calling for help. This information is not intended to replace advice given to you by your health care provider. Make sure you discuss any questions you have with your health care provider. Document Revised: 08/11/2019 Document  Reviewed: 08/11/2019 Elsevier Patient Education  2023 Elsevier Inc.    If you have been instructed to have an in-person evaluation today at a local Urgent Care facility, please use the link below. It will take you to a list of all of our available [facility], including address, phone number and hours of operation. Please do not delay care.  [facility]  If you or a family member do not have a primary care provider, use the link below to schedule a visit and establish care. When you choose a [facility] primary care physician or advanced practice provider, you gain a long-term partner in health. Find a Primary Care Provider  Learn more about [facility]'s in-office and virtual care options: [facility] - Get Care Now

## 2022-03-19 ENCOUNTER — Other Ambulatory Visit: Payer: Self-pay | Admitting: Family Medicine

## 2022-03-19 DIAGNOSIS — Z3490 Encounter for supervision of normal pregnancy, unspecified, unspecified trimester: Secondary | ICD-10-CM

## 2022-03-19 MED ORDER — PRENATAL VITAMIN 27-0.8 MG PO TABS: 1.0000 | ORAL_TABLET | Freq: Every day | ORAL | 11 refills | Status: AC

## 2022-05-25 IMAGING — DX DG KNEE COMPLETE 4+V*L*
4 series · 4 of 4 positions shown · non-contrast
Comparison: None.

CLINICAL DATA: Fell

EXAM:
LEFT KNEE - COMPLETE 4+ VIEW

[knee ap]
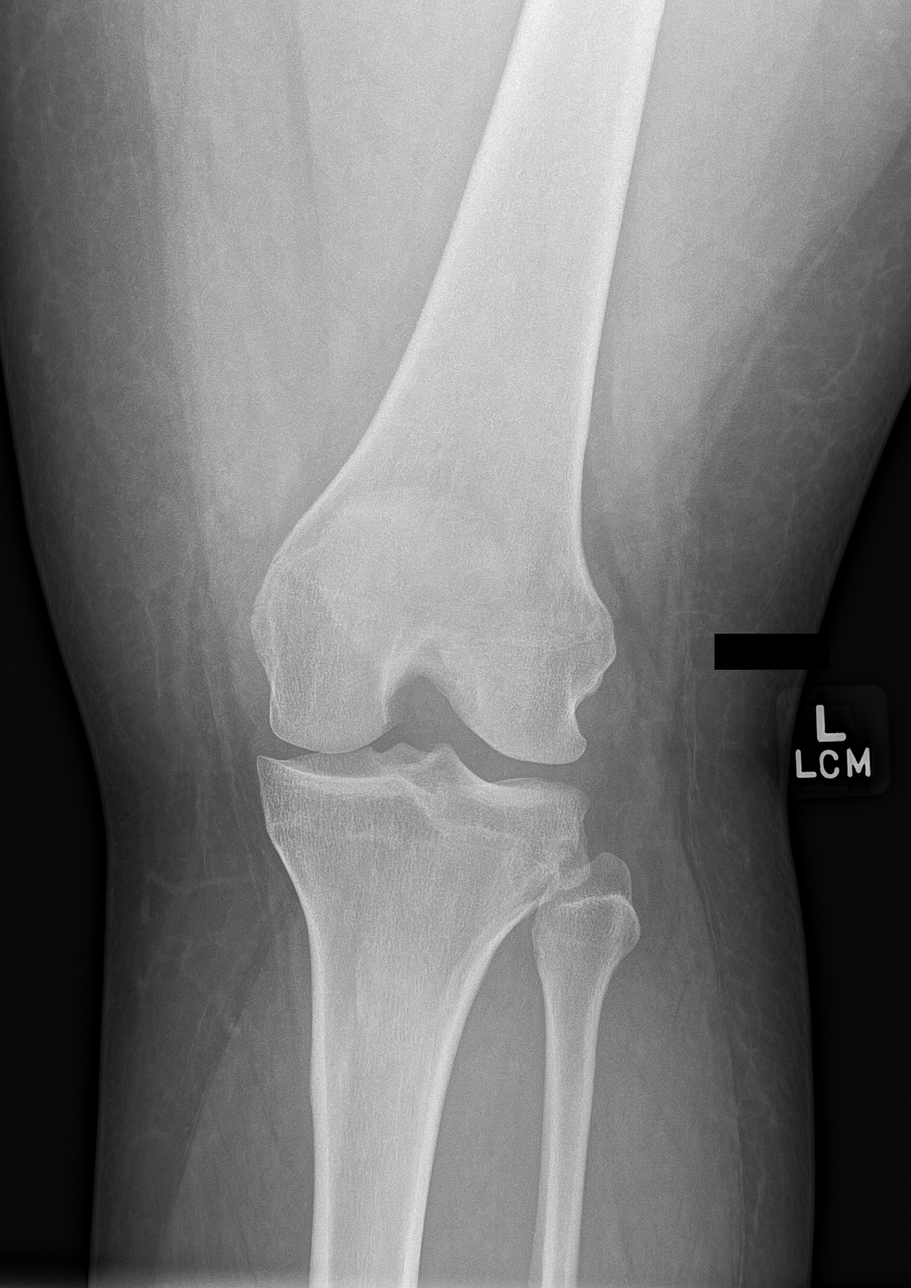

[knee lat]
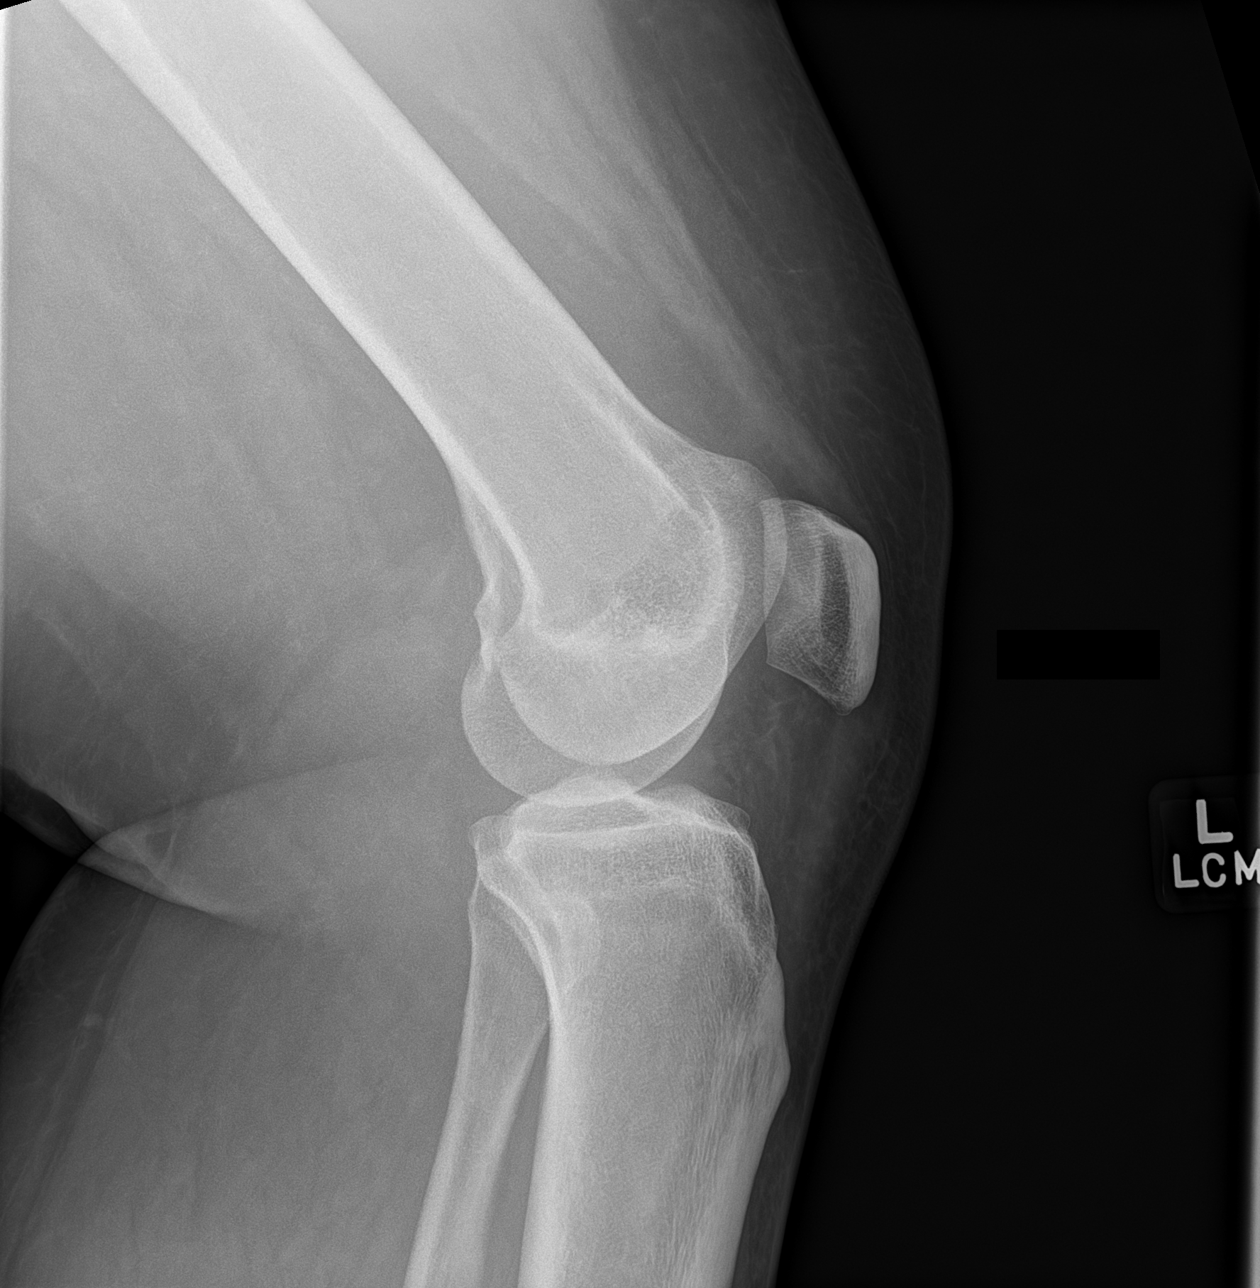

[knee obl (1 of 2)]
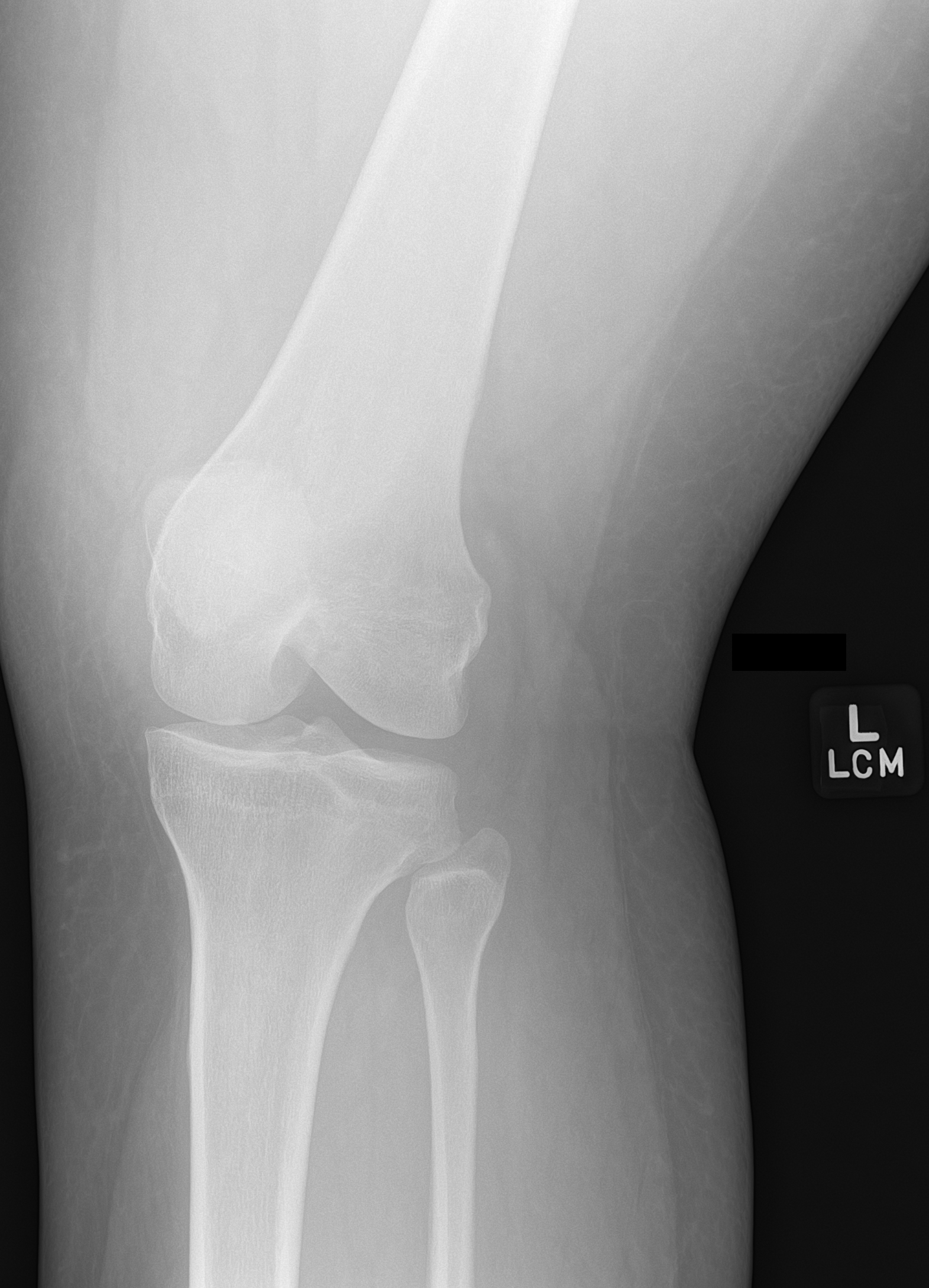

[knee obl (2 of 2)]
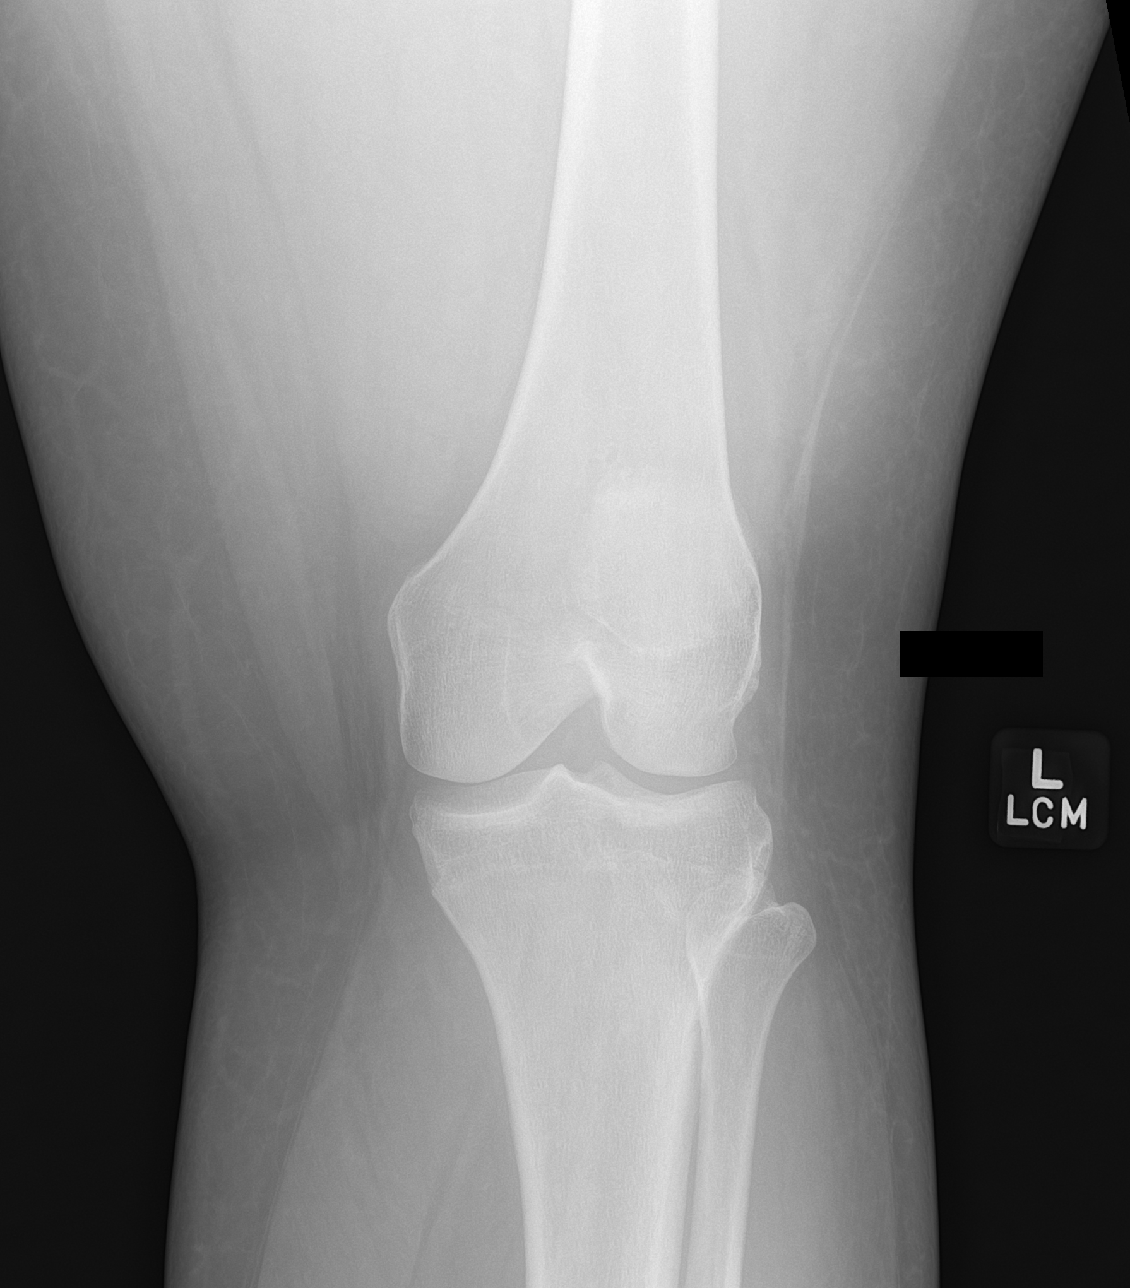

[4 of 4 positions shown; findings below may reference images not displayed]

FINDINGS: No evidence of fracture, dislocation, or joint effusion. No evidence
of arthropathy or other focal bone abnormality. Soft tissues are
unremarkable.
IMPRESSION: Negative.

## 2022-08-06 ENCOUNTER — Ambulatory Visit (INDEPENDENT_AMBULATORY_CARE_PROVIDER_SITE_OTHER): Payer: Medicaid Other | Admitting: Obstetrics & Gynecology

## 2022-08-06 ENCOUNTER — Other Ambulatory Visit: Payer: Self-pay

## 2022-08-06 ENCOUNTER — Encounter: Payer: Self-pay | Admitting: Obstetrics & Gynecology

## 2022-08-06 ENCOUNTER — Other Ambulatory Visit (HOSPITAL_COMMUNITY)
Admission: RE | Admit: 2022-08-06 | Discharge: 2022-08-06 | Disposition: A | Discharge status: Home / Self Care | Payer: Medicaid Other | Source: Ambulatory Visit | Attending: Obstetrics & Gynecology | Admitting: Obstetrics & Gynecology

## 2022-08-06 VITALS — BP 127/74 | HR 79 | Ht 70.0 in | Wt 279.5 lb

## 2022-08-06 DIAGNOSIS — T839XXA Unspecified complication of genitourinary prosthetic device, implant and graft, initial encounter: Secondary | ICD-10-CM

## 2022-08-06 DIAGNOSIS — N898 Other specified noninflammatory disorders of vagina: Secondary | ICD-10-CM | POA: Insufficient documentation

## 2022-08-06 NOTE — Progress Notes (Signed)
Patient ID: Barbara Daniels, female   DOB: June 13, 1995, 27 y.o.   MRN: [id number]  Chief Complaint  Patient presents with   Gynecologic Exam    HPI Barbara Daniels is a 27 y.o. female.  G1P0101 No LMP recorded. (Menstrual status: IUD). She has menses every 2-3 months with mirena in place since 11/2021. 5 days ago she had intercourse and she noticed some pelvic discomfort as though she can feel the IUD. No  d/c on bleeding HPI  Past Medical History:  Diagnosis Date   Anxiety    Asthma    Depression    History of chickenpox    Hyperlipidemia     Past Surgical History:  Procedure Laterality Date   WISDOM TOOTH EXTRACTION      Family History  Problem Relation Age of Onset   Diabetes Father    Hypertension Father    Diabetes Maternal Grandmother    Arthritis Paternal Grandmother    Heart failure Paternal Grandfather     Social History Social History   Tobacco Use   Smoking status: Never   Smokeless tobacco: Never  Vaping Use   Vaping Use: Never used  Substance Use Topics   Alcohol use: No   Drug use: No    Allergies  Allergen Reactions   Penicillins Hives    Current Outpatient Medications  Medication Sig Dispense Refill   loperamide (IMODIUM A-D) 2 MG tablet Take 1 tablet (2 mg total) by mouth 4 (four) times daily as needed for diarrhea or loose stools. (Patient not taking: Reported on 08/06/2022) 30 tablet 0   naproxen (NAPROSYN) 500 MG tablet Take 1 tablet (500 mg total) by mouth 2 (two) times daily with a meal. (Patient not taking: Reported on 08/06/2022) 30 tablet 0   ondansetron (ZOFRAN-ODT) 4 MG disintegrating tablet Take 1 tablet (4 mg total) by mouth every 8 (eight) hours as needed for nausea or vomiting. (Patient not taking: Reported on 08/06/2022) 20 tablet 0   Prenatal Vit-Fe Fumarate-FA (PRENATAL VITAMIN) 27-0.8 MG TABS Take 1 tablet by mouth daily. (Patient not taking: Reported on 08/06/2022) 30 tablet 11   No current facility-administered medications for  this visit.    Review of Systems Review of Systems  Constitutional: Negative.   Respiratory: Negative.    Cardiovascular: Negative.   Gastrointestinal: Negative.   Genitourinary:  Positive for dyspareunia and pelvic pain. Negative for vaginal bleeding and vaginal discharge.    Blood pressure 127/74, pulse 79, height  (1.778 m), weight 279 lb 8 oz (126.8 kg), currently breastfeeding.  Physical Exam Physical Exam Vitals and nursing note reviewed. Exam conducted with a chaperone present.  Constitutional:      Appearance: She is obese.  Pulmonary:     Effort: Pulmonary effort is normal.  Abdominal:     General: Abdomen is flat.     Palpations: Abdomen is soft.  Genitourinary:    General: Normal vulva.     Exam position: Lithotomy position.     Vagina: Normal.     Cervix: Normal.     Uterus: Normal.      Adnexa: Right adnexa normal and left adnexa normal.     Comments: String 1.5 cm at the os Neurological:     Mental Status: She is alert.  Psychiatric:        Mood and Affect: Mood normal.        Behavior: Behavior normal.     Data Reviewed IUD insertion note  Assessment Pain with IUD in  place  Plan Orders Placed This Encounter  Procedures   US PELVIC COMPLETE WITH TRANSVAGINAL    Standing Status:   Future    Standing Expiration Date:   08/06/2023    Order Specific Question:   Reason for Exam (SYMPTOM  OR DIAGNOSIS REQUIRED)    Answer:   pain with IUD in place    Order Specific Question:   Preferred imaging location?    Answer:   [facility]    Order Specific Question:   Release to patient    Answer:   Immediate       Scheryl Darter 08/06/2022, 11:19 AM

## 2022-08-06 NOTE — Addendum Note (Signed)
Addended by: Henrietta Dine on: 08/06/2022 11:47 AM   Modules accepted: Orders

## 2022-08-06 NOTE — Progress Notes (Signed)
Pt reports that she has been having pain in her vagina for the last 5 days, no bleeding, happened after she had intercourse.

## 2022-08-07 LAB — CERVICOVAGINAL ANCILLARY ONLY
Bacterial Vaginitis (gardnerella): POSITIVE — AB
Candida Glabrata: NEGATIVE
Candida Vaginitis: NEGATIVE
Chlamydia: NEGATIVE
Comment: NEGATIVE
Comment: NEGATIVE
Comment: NEGATIVE
Comment: NEGATIVE
Comment: NEGATIVE
Comment: NORMAL
Neisseria Gonorrhea: NEGATIVE
Trichomonas: NEGATIVE

## 2022-08-12 ENCOUNTER — Telehealth: Payer: Medicaid Other

## 2022-08-14 ENCOUNTER — Other Ambulatory Visit: Payer: Self-pay | Admitting: *Deleted

## 2022-08-14 ENCOUNTER — Encounter: Payer: Self-pay | Admitting: *Deleted

## 2022-08-14 ENCOUNTER — Ambulatory Visit (HOSPITAL_COMMUNITY): Payer: Medicaid Other | Attending: Obstetrics & Gynecology

## 2022-08-14 IMAGING — US US MFM OB FOLLOW-UP
1 series · 13 of 28 positions shown · non-contrast
Comparison: none

[Series 1: us mfm ob follow-up · 13 of 38 slices shown]
[im 2/38]
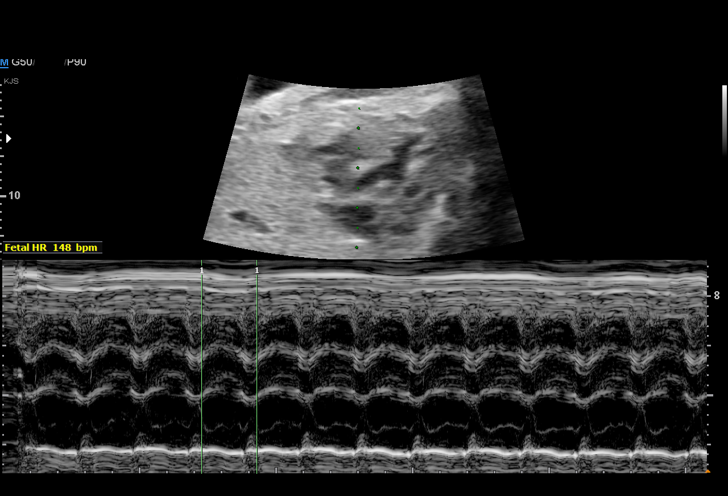
[im 5/38]
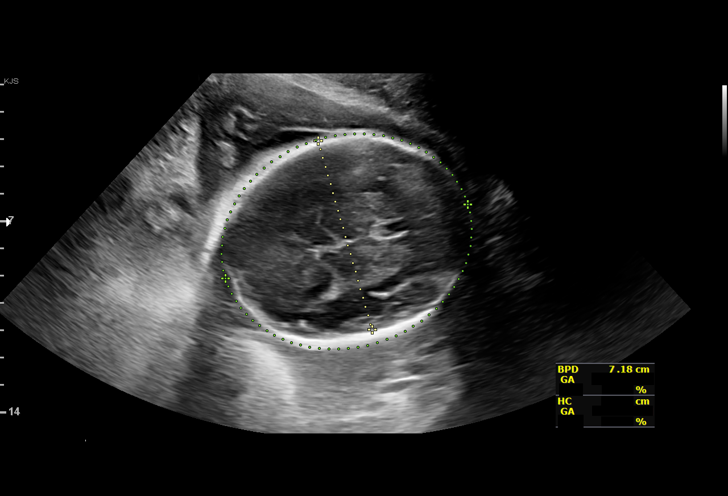
[im 7/38]
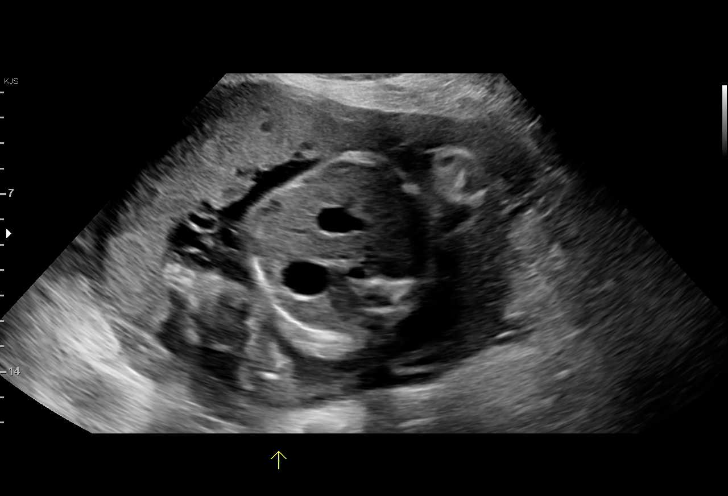
[im 10/38]
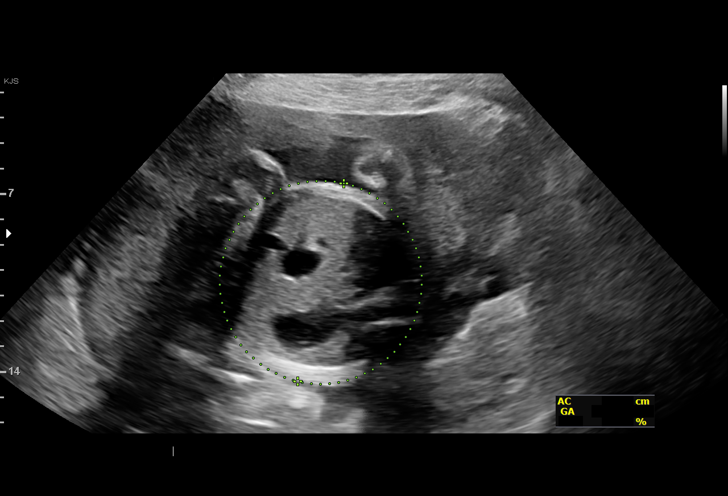
[im 13/38]
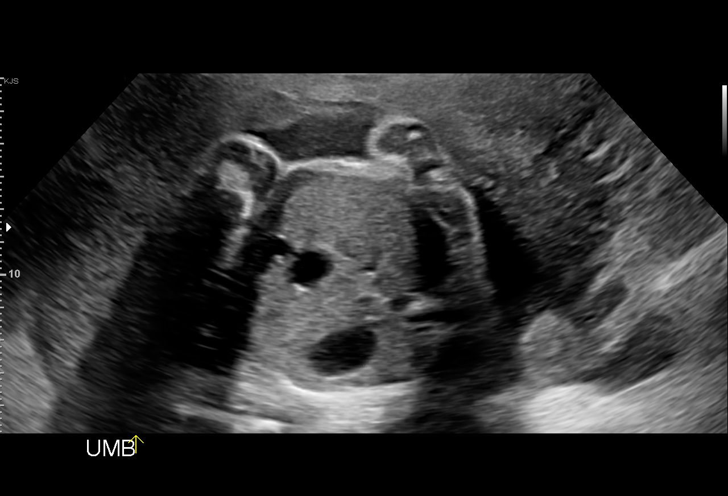
[im 16/38]
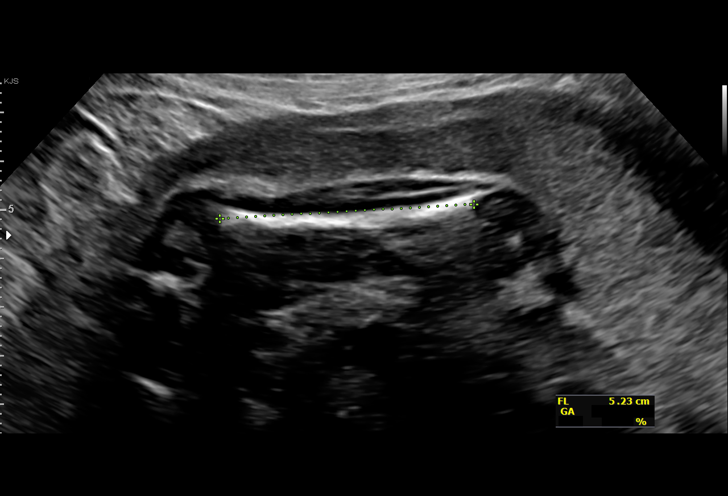
[im 20/38]
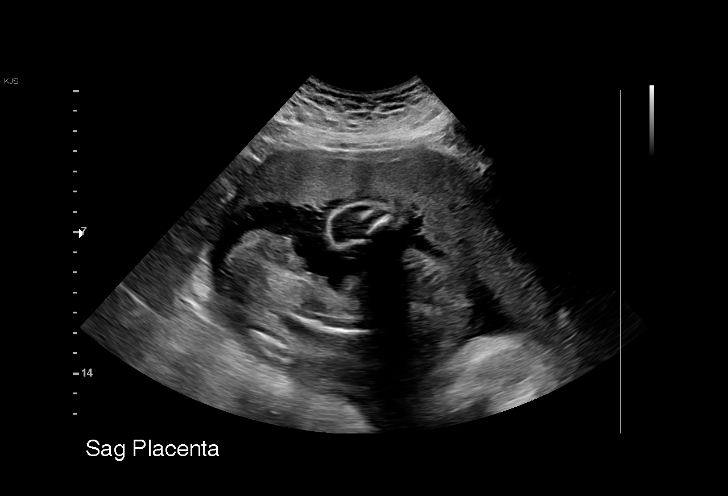
[im 22/38]
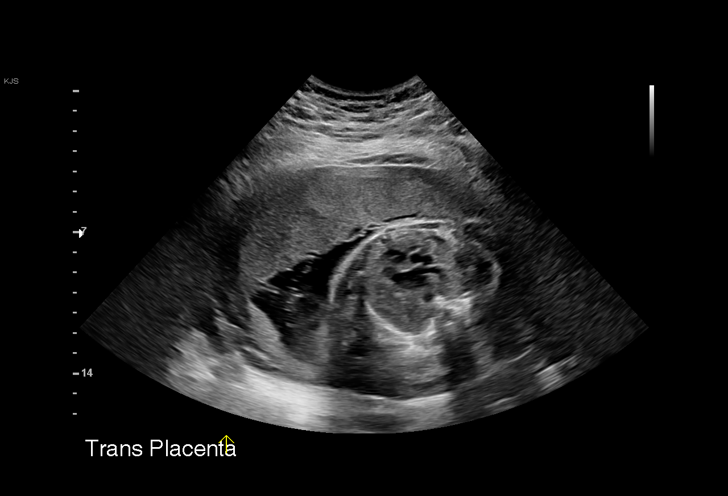
[im 25/38]
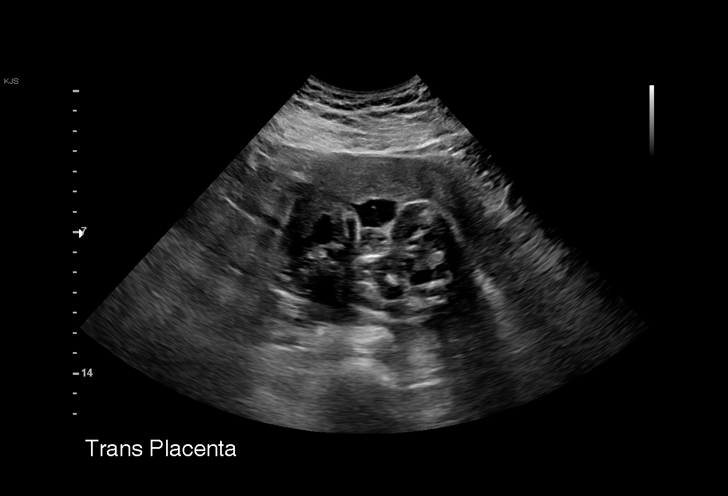
[im 28/38]
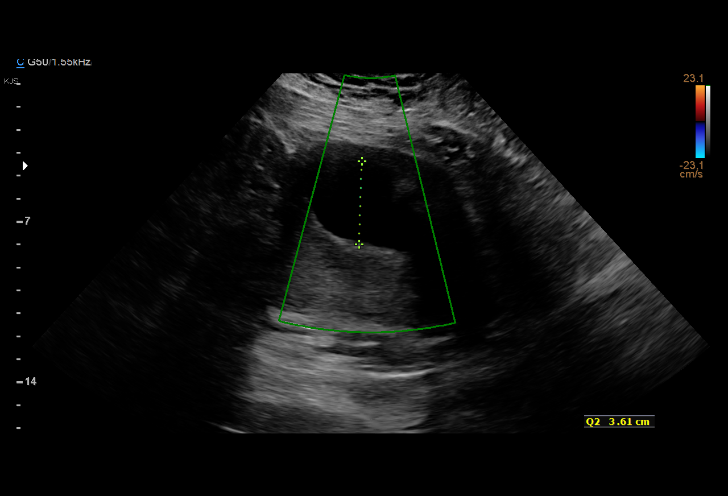
[im 31/38]
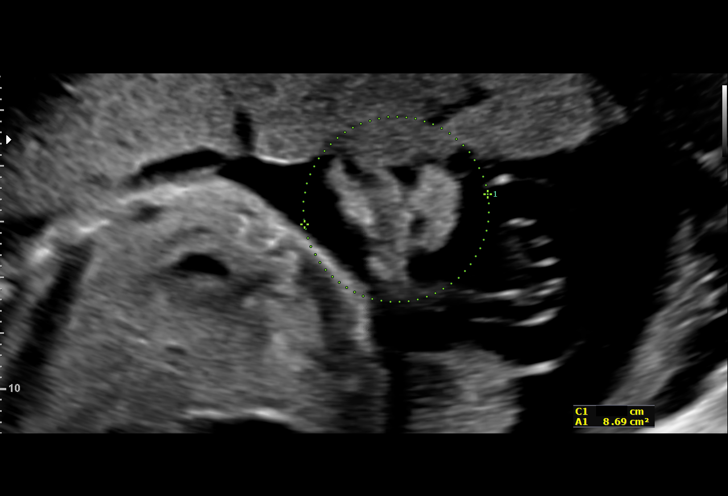
[im 33/38]
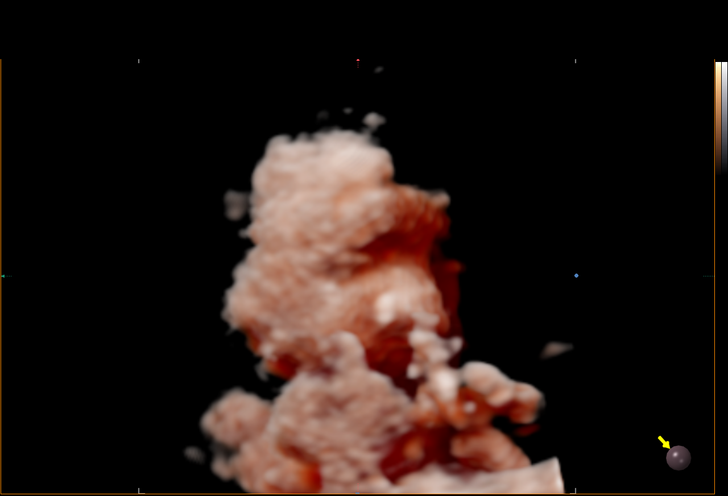
[im 36/38]
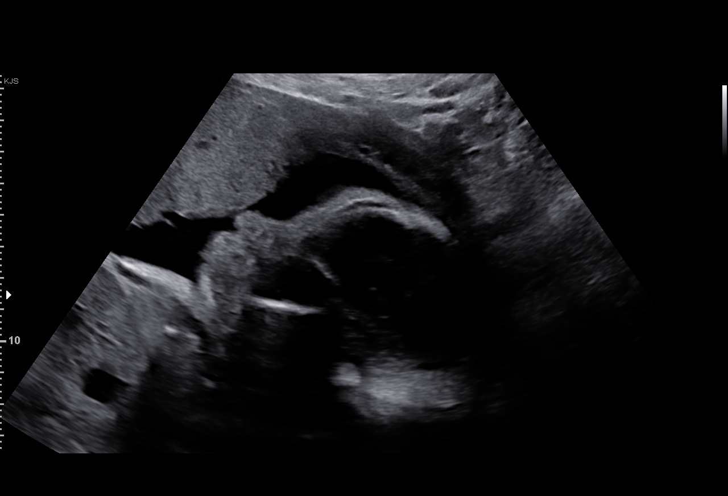

[13 of 28 positions shown; findings below may reference images not displayed]

[id number]

Indications

 Obesity complicating pregnancy, third
 trimester
 Asthma                                         0KK.9K [id number]
 Encounter for other antenatal screening
 follow-up
 28 weeks gestation of pregnancy
Fetal Evaluation

 Num Of Fetuses:         1
 Fetal Heart Rate(bpm):  148
 Cardiac Activity:       Observed
 Presentation:           Cephalic
 Placenta:               Anterior
 P. Cord Insertion:      Previously Visualized

 Amniotic Fluid
 AFI FV:      Within normal limits

 AFI Sum(cm)     %Tile       Largest Pocket(cm)
 17.12           64

 RUQ(cm)       RLQ(cm)       LUQ(cm)        LLQ(cm)

Biometry

 BPD:        72  mm     G. Age:  28w 6d         63  %    CI:        73.37   %    70 - 86
                                                         FL/HC:      19.5   %    18.8 -
 HC:      267.1  mm     G. Age:  29w 1d         49  %    HC/AC:      1.07        1.05 -
 AC:      248.7  mm     G. Age:  29w 1d         72  %    FL/BPD:     72.2   %    71 - 87
 FL:         52  mm     G. Age:  27w 5d         24  %    FL/AC:      20.9   %    20 - 24

 Est. FW:    2585  gm    2 lb 12 oz      56  %
OB History

 Blood Type:   O+
 Gravidity:    1
Gestational Age

 LMP:           28w 1d        Date:  02/17/21                 EDD:   11/24/21
 U/S Today:     28w 5d                                        EDD:   11/20/21
 Best:          28w 1d     Det. By:  LMP  (02/17/21)          EDD:   11/24/21
Anatomy

 Cranium:               Appears normal         LVOT:                   Previously seen
 Cavum:                 Appears normal         Aortic Arch:            Previously seen
 Ventricles:            Previously seen        Ductal Arch:            Previously seen
 Choroid Plexus:        Previously seen        Diaphragm:              Appears normal
 Cerebellum:            Previously seen        Stomach:                Appears normal, left
                                                                       sided
 Posterior Fossa:       Previously seen        Abdomen:                Umbilical vein
                                                                       varix
 Nuchal Fold:           Previously seen        Abdominal Wall:         Previously seen
 Face:                  Orbits and profile     Cord Vessels:           Previously seen
                        previously seen
 Lips:                  Appears normal         Kidneys:                Previously seen
 Palate:                Not well visualized    Bladder:                Appears normal
 Thoracic:              Previously seen        Spine:                  Previously seen
 Heart:                 Previously seen        Upper Extremities:      Previously seen
 RVOT:                  Previously seen        Lower Extremities:      Previously seen

 Other:  Male gender previously seen. Heels/feet and open hands/5th digits,
         VC, 3VV and 3VTV previously visualized.
Cervix Uterus Adnexa

 Cervix
 Not visualized (advanced GA >16wks)
Comments

 This patient was seen for a follow up growth scan due to
 maternal obesity with a BMI of 40.  An umbilical vein varix
 was noted on her prior exam.  She denies any problems
 since her last exam and has screened negative for
 gestational diabetes.
 She was informed that the fetal growth and amniotic fluid
 level appears appropriate for her gestational age.
 The umbilical vein varix continues to be noted on today's
 exam.
 Due to maternal obesity and an umbilical vein varix, a follow-
 up growth scan was scheduled in 4 weeks.  We will also start
 weekly fetal testing at that time.

## 2022-08-14 MED ORDER — METRONIDAZOLE 500 MG PO TABS: 500.0000 mg | ORAL_TABLET | Freq: Two times a day (BID) | ORAL | 0 refills | Status: AC

## 2022-10-09 ENCOUNTER — Ambulatory Visit: Payer: Medicaid Other

## 2022-10-09 ENCOUNTER — Other Ambulatory Visit (HOSPITAL_COMMUNITY)
Admission: RE | Admit: 2022-10-09 | Discharge: 2022-10-09 | Disposition: A | Discharge status: Home / Self Care | Payer: Medicaid Other | Source: Ambulatory Visit | Attending: Family Medicine | Admitting: Family Medicine

## 2022-10-09 VITALS — BP 124/66 | HR 72 | Ht 69.0 in | Wt 279.0 lb

## 2022-10-09 DIAGNOSIS — Z202 Contact with and (suspected) exposure to infections with a predominantly sexual mode of transmission: Secondary | ICD-10-CM | POA: Diagnosis present

## 2022-10-09 DIAGNOSIS — N3000 Acute cystitis without hematuria: Secondary | ICD-10-CM

## 2022-10-09 NOTE — Progress Notes (Signed)
Barbara Daniels is here with concern of possible exposure to std from potentially unfaithful boyfriend, per patient. Patient also c/o cloudy urine.    Self swab instructions given and specimen obtained; STD blood work drawn by lab. Explained patient will be contacted with any abnormal results. Urine culture ordered.     Meryl Crutch, RN 10/09/2022  3:22 PM

## 2022-10-10 LAB — RPR: RPR Ser Ql: NONREACTIVE

## 2022-10-10 LAB — HEPATITIS C ANTIBODY: Hep C Virus Ab: NONREACTIVE

## 2022-10-10 LAB — HEPATITIS B SURFACE ANTIGEN: Hepatitis B Surface Ag: NEGATIVE

## 2022-10-10 LAB — HIV ANTIBODY (ROUTINE TESTING W REFLEX): HIV Screen 4th Generation wRfx: NONREACTIVE

## 2022-10-13 LAB — CERVICOVAGINAL ANCILLARY ONLY
Bacterial Vaginitis (gardnerella): POSITIVE — AB
Candida Glabrata: NEGATIVE
Candida Vaginitis: NEGATIVE
Chlamydia: NEGATIVE
Comment: NEGATIVE
Comment: NEGATIVE
Comment: NEGATIVE
Comment: NEGATIVE
Comment: NEGATIVE
Comment: NORMAL
Neisseria Gonorrhea: NEGATIVE
Trichomonas: NEGATIVE

## 2022-10-13 LAB — URINE CULTURE

## 2022-10-13 MED ORDER — SULFAMETHOXAZOLE-TRIMETHOPRIM 800-160 MG PO TABS
1.0000 | ORAL_TABLET | Freq: Two times a day (BID) | ORAL | 0 refills | Status: AC
Start: 2022-10-13 — End: 2022-10-18

## 2022-10-13 MED ORDER — METRONIDAZOLE 0.75 % VA GEL: 1.0000 | Freq: Every day | VAGINAL | 0 refills | Status: AC

## 2022-10-13 NOTE — Addendum Note (Signed)
Addended byMerian Capron on: 10/13/2022 05:00 PM   Modules accepted: Orders

## 2022-12-02 ENCOUNTER — Telehealth: Payer: Medicaid Other | Admitting: Family Medicine

## 2022-12-02 DIAGNOSIS — R3989 Other symptoms and signs involving the genitourinary system: Secondary | ICD-10-CM | POA: Diagnosis not present

## 2022-12-02 MED ORDER — NITROFURANTOIN MONOHYD MACRO 100 MG PO CAPS
100.0000 mg | ORAL_CAPSULE | Freq: Two times a day (BID) | ORAL | 0 refills | Status: AC
Start: 2022-12-02 — End: 2022-12-07

## 2022-12-02 NOTE — Progress Notes (Signed)
Virtual Visit Consent   Barbara Daniels, you are scheduled for a virtual visit with a [facility] provider today. Just as with appointments in the office, your consent must be obtained to participate. Your consent will be active for this visit and any virtual visit you may have with one of our providers in the next 365 days. If you have a MyChart account, a copy of this consent can be sent to you electronically.  As this is a virtual visit, video technology does not allow for your provider to perform a traditional examination. This may limit your provider's ability to fully assess your condition. If your provider identifies any concerns that need to be evaluated in person or the need to arrange testing (such as labs, EKG, etc.), we will make arrangements to do so. Although advances in technology are sophisticated, we cannot ensure that it will always work on either your end or our end. If the connection with a video visit is poor, the visit may have to be switched to a telephone visit. With either a video or telephone visit, we are not always able to ensure that we have a secure connection.  By engaging in this virtual visit, you consent to the provision of healthcare and authorize for your insurance to be billed (if applicable) for the services provided during this visit. Depending on your insurance coverage, you may receive a charge related to this service.  I need to obtain your verbal consent now. Are you willing to proceed with your visit today? Barbara Daniels has provided verbal consent on 12/02/2022 for a virtual visit (video or telephone). [name], NP  Date: 12/02/2022 3:01 PM  Virtual Visit via Video Note   I, [name], connected with  Barbara Daniels  ([id number], 26-Apr-1995) on 12/02/22 at  3:00 PM EDT by a video-enabled telemedicine application and verified that I am speaking with the correct person using two identifiers.  Location: Patient: Virtual Visit Location Patient:  Home Provider: Virtual Visit Location Provider: Home Office   I discussed the limitations of evaluation and management by telemedicine and the availability of in person appointments. The patient expressed understanding and agreed to proceed.    History of Present Illness: Barbara Daniels is a 27 y.o. who identifies as a female who was assigned female at birth, and is being seen today for UTI   HPI: Urinary Tract Infection  This is a new problem. The current episode started in the past 7 days. The problem occurs every urination. The problem has been unchanged. The quality of the pain is described as burning. The pain is at a severity of 5/10. The pain is moderate. There has been no fever. She is Sexually active. There is No history of pyelonephritis. Associated symptoms include frequency and nausea. Pertinent negatives include no chills, flank pain, hematuria, possible pregnancy or vomiting. Treatments tried: cranberry pills, cranberry juice. The treatment provided no relief.    Problems:  Patient Active Problem List   Diagnosis Date Noted   GAD (generalized anxiety disorder) 10/30/2016   Mild intermittent asthma 06/27/2016    Allergies:  Allergies  Allergen Reactions   Penicillins Hives   Medications:  Current Outpatient Medications:    loperamide (IMODIUM A-D) 2 MG tablet, Take 1 tablet (2 mg total) by mouth 4 (four) times daily as needed for diarrhea or loose stools. (Patient not taking: Reported on 08/06/2022), Disp: 30 tablet, Rfl: 0   metroNIDAZOLE (FLAGYL) 500 MG tablet, Take 1  tablet (500 mg total) by mouth 2 (two) times daily., Disp: 14 tablet, Rfl: 0   naproxen (NAPROSYN) 500 MG tablet, Take 1 tablet (500 mg total) by mouth 2 (two) times daily with a meal. (Patient not taking: Reported on 08/06/2022), Disp: 30 tablet, Rfl: 0   ondansetron (ZOFRAN-ODT) 4 MG disintegrating tablet, Take 1 tablet (4 mg total) by mouth every 8 (eight) hours as needed for nausea or vomiting. (Patient not  taking: Reported on 08/06/2022), Disp: 20 tablet, Rfl: 0   Prenatal Vit-Fe Fumarate-FA (PRENATAL VITAMIN) 27-0.8 MG TABS, Take 1 tablet by mouth daily. (Patient not taking: Reported on 08/06/2022), Disp: 30 tablet, Rfl: 11  Observations/Objective: Patient is well-developed, well-nourished in no acute distress.  Resting comfortably  at home.  Head is normocephalic, atraumatic.  No labored breathing.  Speech is clear and coherent with logical content.  Patient is alert and oriented at baseline.    Assessment and Plan:  1. Suspected UTI  - nitrofurantoin, macrocrystal-monohydrate, (MACROBID) 100 MG capsule; Take 1 capsule (100 mg total) by mouth 2 (two) times daily for 5 days.  Dispense: 10 capsule; Refill: 0  -no other red flags for stone or kidney infection -increase fluids -complete medication as discussed -prevention discussed and on AVS   Reviewed side effects, risks and benefits of medication.    Patient acknowledged agreement and understanding of the plan.   Past Medical, Surgical, Social History, Allergies, and Medications have been Reviewed.   Follow Up Instructions: I discussed the assessment and treatment plan with the patient. The patient was provided an opportunity to ask questions and all were answered. The patient agreed with the plan and demonstrated an understanding of the instructions.  A copy of instructions were sent to the patient via MyChart unless otherwise noted below.     The patient was advised to call back or seek an in-person evaluation if the symptoms worsen or if the condition fails to improve as anticipated.  Time:  I spent 10 minutes with the patient via telehealth technology discussing the above problems/concerns.    [name], NP

## 2022-12-02 NOTE — Patient Instructions (Addendum)
Barbara Daniels, thank you for joining [name], NP for today's virtual visit.  While this provider is not your primary care provider (PCP), if your PCP is located in our provider database this encounter information will be shared with them immediately following your visit.   A [facility] MyChart account gives you access to today's visit and all your visits, tests, and labs performed at [facility] " click here if you don't have a [facility] MyChart account or go to [email or web address]  Consent: (Patient) Barbara Daniels provided verbal consent for this virtual visit at the beginning of the encounter.  Current Medications:  Current Outpatient Medications:    nitrofurantoin, macrocrystal-monohydrate, (MACROBID) 100 MG capsule, Take 1 capsule (100 mg total) by mouth 2 (two) times daily for 5 days., Disp: 10 capsule, Rfl: 0   loperamide (IMODIUM A-D) 2 MG tablet, Take 1 tablet (2 mg total) by mouth 4 (four) times daily as needed for diarrhea or loose stools. (Patient not taking: Reported on 08/06/2022), Disp: 30 tablet, Rfl: 0   metroNIDAZOLE (FLAGYL) 500 MG tablet, Take 1 tablet (500 mg total) by mouth 2 (two) times daily., Disp: 14 tablet, Rfl: 0   naproxen (NAPROSYN) 500 MG tablet, Take 1 tablet (500 mg total) by mouth 2 (two) times daily with a meal. (Patient not taking: Reported on 08/06/2022), Disp: 30 tablet, Rfl: 0   ondansetron (ZOFRAN-ODT) 4 MG disintegrating tablet, Take 1 tablet (4 mg total) by mouth every 8 (eight) hours as needed for nausea or vomiting. (Patient not taking: Reported on 08/06/2022), Disp: 20 tablet, Rfl: 0   Prenatal Vit-Fe Fumarate-FA (PRENATAL VITAMIN) 27-0.8 MG TABS, Take 1 tablet by mouth daily. (Patient not taking: Reported on 08/06/2022), Disp: 30 tablet, Rfl: 11   Medications ordered in this encounter:  Meds ordered this encounter  Medications   nitrofurantoin, macrocrystal-monohydrate, (MACROBID) 100 MG capsule    Sig: Take 1 capsule (100  mg total) by mouth 2 (two) times daily for 5 days.    Dispense:  10 capsule    Refill:  0    Order Specific Question:   Supervising Provider    Answer:   Merrilee Jansky [id number]     *If you need refills on other medications prior to your next appointment, please contact your pharmacy*  Follow-Up: Call back or seek an in-person evaluation if the symptoms worsen or if the condition fails to improve as anticipated.  [facility] [phone]  Other Instructions  -no other red flags for stone or kidney infection -increase fluids -complete medication as discussed -prevention discussed   Urinary Tract Infection, Adult A urinary tract infection (UTI) is an infection of any part of the urinary tract. The urinary tract includes: The kidneys. The ureters. The bladder. The urethra. These organs make, store, and get rid of pee (urine) in the body. What are the causes? This infection is caused by germs (bacteria) in your genital area. These germs grow and cause swelling (inflammation) of your urinary tract. What increases the risk? The following factors may make you more likely to develop this condition: Using a small, thin tube (catheter) to drain pee. Not being able to control when you pee or poop (incontinence). Being female. If you are female, these things can increase the risk: Using these methods to prevent pregnancy: A medicine that kills sperm (spermicide). A device that blocks sperm (diaphragm). Having low levels of a female hormone (estrogen). Being pregnant. You are more likely to develop  this condition if: You have genes that add to your risk. You are sexually active. You take antibiotic medicines. You have trouble peeing because of: A prostate that is bigger than normal, if you are female. A blockage in the part of your body that drains pee from the bladder. A kidney stone. A nerve condition that affects your bladder. Not getting enough to drink. Not  peeing often enough. You have other conditions, such as: Diabetes. A weak disease-fighting system (immune system). Sickle cell disease. Gout. Injury of the spine. What are the signs or symptoms? Symptoms of this condition include: Needing to pee right away. Peeing small amounts often. Pain or burning when peeing. Blood in the pee. Pee that smells bad or not like normal. Trouble peeing. Pee that is cloudy. Fluid coming from the vagina, if you are female. Pain in the belly or lower back. Other symptoms include: Vomiting. Not feeling hungry. Feeling mixed up (confused). This may be the first symptom in older adults. Being tired and grouchy (irritable). A fever. Watery poop (diarrhea). How is this treated? Taking antibiotic medicine. Taking other medicines. Drinking enough water. In some cases, you may need to see a specialist. Follow these instructions at home:  Medicines Take over-the-counter and prescription medicines only as told by your doctor. If you were prescribed an antibiotic medicine, take it as told by your doctor. Do not stop taking it even if you start to feel better. General instructions Make sure you: Pee until your bladder is empty. Do not hold pee for a long time. Empty your bladder after sex. Wipe from front to back after peeing or pooping if you are a female. Use each tissue one time when you wipe. Drink enough fluid to keep your pee pale yellow. Keep all follow-up visits. Contact a doctor if: You do not get better after 1-2 days. Your symptoms go away and then come back. Get help right away if: You have very bad back pain. You have very bad pain in your lower belly. You have a fever. You have chills. You feeling like you will vomit or you vomit. Summary A urinary tract infection (UTI) is an infection of any part of the urinary tract. This condition is caused by germs in your genital area. There are many risk factors for a UTI. Treatment  includes antibiotic medicines. Drink enough fluid to keep your pee pale yellow. This information is not intended to replace advice given to you by your health care provider. Make sure you discuss any questions you have with your health care provider. Document Revised: 11/06/2019 Document Reviewed: 11/11/2019 Elsevier Patient Education  2024 Elsevier Inc.    If you have been instructed to have an in-person evaluation today at a local Urgent Care facility, please use the link below. It will take you to a list of all of our available [facility], including address, phone number and hours of operation. Please do not delay care.  [facility]  If you or a family member do not have a primary care provider, use the link below to schedule a visit and establish care. When you choose a [facility] primary care physician or advanced practice provider, you gain a long-term partner in health. Find a Primary Care Provider  Learn more about [facility]'s in-office and virtual care options: [facility] - Get Care Now

## 2022-12-11 ENCOUNTER — Telehealth: Payer: Medicaid Other

## 2022-12-11 ENCOUNTER — Encounter (HOSPITAL_COMMUNITY): Payer: Self-pay

## 2022-12-11 ENCOUNTER — Telehealth: Payer: Self-pay | Admitting: Family Medicine

## 2022-12-11 ENCOUNTER — Emergency Department (HOSPITAL_COMMUNITY)
Admission: EM | Admit: 2022-12-11 | Discharge: 2022-12-12 | Discharge status: Against Medical Advice | Payer: Medicaid Other | Attending: Emergency Medicine | Admitting: Emergency Medicine

## 2022-12-11 ENCOUNTER — Telehealth: Payer: Medicaid Other | Admitting: Nurse Practitioner

## 2022-12-11 ENCOUNTER — Other Ambulatory Visit: Payer: Self-pay

## 2022-12-11 ENCOUNTER — Telehealth: Payer: Medicaid Other | Admitting: Family Medicine

## 2022-12-11 DIAGNOSIS — R102 Pelvic and perineal pain: Secondary | ICD-10-CM | POA: Insufficient documentation

## 2022-12-11 DIAGNOSIS — M545 Low back pain, unspecified: Secondary | ICD-10-CM

## 2022-12-11 DIAGNOSIS — Z5321 Procedure and treatment not carried out due to patient leaving prior to being seen by health care provider: Secondary | ICD-10-CM | POA: Insufficient documentation

## 2022-12-11 DIAGNOSIS — R6883 Chills (without fever): Secondary | ICD-10-CM

## 2022-12-11 DIAGNOSIS — R52 Pain, unspecified: Secondary | ICD-10-CM

## 2022-12-11 DIAGNOSIS — Z975 Presence of (intrauterine) contraceptive device: Secondary | ICD-10-CM

## 2022-12-11 DIAGNOSIS — R110 Nausea: Secondary | ICD-10-CM

## 2022-12-11 LAB — CBC WITH DIFFERENTIAL/PLATELET
Abs Immature Granulocytes: 0.02 10*3/uL (ref 0.00–0.07)
Basophils Absolute: 0.1 10*3/uL (ref 0.0–0.1)
Basophils Relative: 1 %
Eosinophils Absolute: 0.2 10*3/uL (ref 0.0–0.5)
Eosinophils Relative: 3 %
HCT: 37 % (ref 36.0–46.0)
Hemoglobin: 12.8 g/dL (ref 12.0–15.0)
Immature Granulocytes: 0 %
Lymphocytes Relative: 47 %
Lymphs Abs: 3.5 10*3/uL (ref 0.7–4.0)
MCH: 30.4 pg (ref 26.0–34.0)
MCHC: 34.6 g/dL (ref 30.0–36.0)
MCV: 87.9 fL (ref 80.0–100.0)
Monocytes Absolute: 0.6 10*3/uL (ref 0.1–1.0)
Monocytes Relative: 8 %
Neutro Abs: 3 10*3/uL (ref 1.7–7.7)
Neutrophils Relative %: 41 %
Platelets: 321 10*3/uL (ref 150–400)
RBC: 4.21 MIL/uL (ref 3.87–5.11)
RDW: 12 % (ref 11.5–15.5)
WBC: 7.4 10*3/uL (ref 4.0–10.5)
nRBC: 0 % (ref 0.0–0.2)

## 2022-12-11 LAB — URINALYSIS, ROUTINE W REFLEX MICROSCOPIC
Bilirubin Urine: NEGATIVE
Glucose, UA: NEGATIVE mg/dL
Ketones, ur: NEGATIVE mg/dL
Nitrite: NEGATIVE
Protein, ur: NEGATIVE mg/dL
Specific Gravity, Urine: 1.024 (ref 1.005–1.030)
pH: 6 (ref 5.0–8.0)

## 2022-12-11 LAB — PREGNANCY, URINE: Preg Test, Ur: NEGATIVE

## 2022-12-11 LAB — COMPREHENSIVE METABOLIC PANEL
ALT: 22 U/L (ref 0–44)
AST: 20 U/L (ref 15–41)
Albumin: 3.4 g/dL — ABNORMAL LOW (ref 3.5–5.0)
Alkaline Phosphatase: 55 U/L (ref 38–126)
Anion gap: 13 (ref 5–15)
BUN: 14 mg/dL (ref 6–20)
CO2: 23 mmol/L (ref 22–32)
Calcium: 9.1 mg/dL (ref 8.9–10.3)
Chloride: 102 mmol/L (ref 98–111)
Creatinine, Ser: 0.78 mg/dL (ref 0.44–1.00)
GFR, Estimated: >60 mL/min (ref >60)
Glucose, Bld: 96 mg/dL (ref 70–99)
Potassium: 3.9 mmol/L (ref 3.5–5.1)
Sodium: 138 mmol/L (ref 135–145)
Total Bilirubin: 0.7 mg/dL (ref 0.3–1.2)
Total Protein: 6.8 g/dL (ref 6.5–8.1)

## 2022-12-11 LAB — RAPID HIV SCREEN (HIV 1/2 AB+AG)
HIV 1/2 Antibodies: NONREACTIVE
HIV-1 P24 Antigen - HIV24: NONREACTIVE

## 2022-12-11 NOTE — Telephone Encounter (Signed)
Returned call to patient who is now in [facility] ED. She was seen for virtual visit by family medicine provider and it was recommended she have either GYN visit before the weekend or be seen at ED. Pt decided to be seen at ED to be safe since she was unsure of our appt availability. Encouraged pt to contact our office to schedule any follow up recommended by ED.

## 2022-12-11 NOTE — ED Triage Notes (Signed)
Vaginal pain x 2 weeks and thought I was a UTI and did a virtual appt and was prescribed for what she thought was a UTI.  Patient did not pick up medication.  Patient reports bumps that are spreading in her genitals.  Reports she has swollen lymph nodes in the groin.  Unsure if it is infected ingrown hair.  Complains generalized body aches as well.

## 2022-12-11 NOTE — Progress Notes (Signed)
Based on what you shared with me, I feel your condition warrants further evaluation as soon as possible at an Emergency department.    NOTE: There will be NO CHARGE for this eVisit

## 2022-12-11 NOTE — Patient Instructions (Signed)
      For an urgent face to face visit, [facility] has eight urgent care centers for your convenience:   NEW!! [facility] [place] [facility] [place] Get Driving Directions [phone] [address] [place], [place] [facility] [place] [facility] [place] Get Driving Directions [phone] [place], [place] [address]   [facility]) Get Driving Directions [phone] [address], [place] [address]  [facility] [employer] [facility] ([place]) Get Driving Directions [phone] [address] [place],  [place]  [address]  [facility] at [address] [place] Get Driving Directions  [phone] [place],  [place] [address]   [facility] Get Driving Directions [phone] [place], [place], [place] [address]   [facility] Get Driving Directions  [phone] [address] [place], [place] [address]   [facility] [place] Get Driving Directions [phone] [address], [place], [place] [address]  Your MyChart E-visit questionnaire answers were reviewed by a board certified advanced clinical practitioner to complete your personal care plan based on your specific symptoms.  Thank you for using e-Visits.

## 2022-12-11 NOTE — Progress Notes (Signed)
Virtual Visit Consent   Barbara Daniels, you are scheduled for a virtual visit with a [facility] provider today. Just as with appointments in the office, your consent must be obtained to participate. Your consent will be active for this visit and any virtual visit you may have with one of our providers in the next 365 days. If you have a MyChart account, a copy of this consent can be sent to you electronically.  As this is a virtual visit, video technology does not allow for your provider to perform a traditional examination. This may limit your provider's ability to fully assess your condition. If your provider identifies any concerns that need to be evaluated in person or the need to arrange testing (such as labs, EKG, etc.), we will make arrangements to do so. Although advances in technology are sophisticated, we cannot ensure that it will always work on either your end or our end. If the connection with a video visit is poor, the visit may have to be switched to a telephone visit. With either a video or telephone visit, we are not always able to ensure that we have a secure connection.  By engaging in this virtual visit, you consent to the provision of healthcare and authorize for your insurance to be billed (if applicable) for the services provided during this visit. Depending on your insurance coverage, you may receive a charge related to this service.  I need to obtain your verbal consent now. Are you willing to proceed with your visit today? Barbara Daniels has provided verbal consent on 12/11/2022 for a virtual visit (video or telephone). Viviano Simas, FNP  Date: 12/11/2022 12:48 PM  Virtual Visit via Video Note   I, Viviano Simas, connected with  Barbara Daniels  ([id number], [birth year]) on 12/11/22 at 12:45 PM EDT by a video-enabled telemedicine application and verified that I am speaking with the correct person using two identifiers.  Location: Patient: Virtual Visit Location Patient:  Home Provider: Virtual Visit Location Provider: Home Office   I discussed the limitations of evaluation and management by telemedicine and the availability of in person appointments. The patient expressed understanding and agreed to proceed.    History of Present Illness: Barbara Daniels is a 27 y.o. who identifies as a female who was assigned female at birth, and is being seen today with complaints of pelvic pain she rates 6/10  Feels it is getting worse today  Pelvic pain onset 2 weeks ago  She feels like this all day everyday now  She also has back pain that has been present for 2 weeks in her central lower lumbar region  Denies any flank pain  She feels "knots" bilaterally in her lower abdomen that she believes may have always been present, but are now tender she thinks might be her ovaries Denies any history of ovarian pain   She has pain in her knees today and it hurts to straighten her legs and go up and down stairs   She has also noted some "bumps" that she believes might be from ingrown hair in her vaginal area   Had some discomfort with urination recently that resolved with cranberry pills  She has also had headaches more so recently   Increased hunger and thirst   Treated for suspected UTI 9 days ago, states that she never picked up the antibiotic and the following day her "burning" resolved. She was complaining of frequency and nausea as well at that time that she  denies experiencing now   She has also been suffering from constipation   Overall she feels her systemic symptoms are all similar to when she was pregnant last time  She has an IUD that was placed in 11/2021 US pelvic transvaginal order in [facility] has not been performed  She is sexually active    Problems:  Patient Active Problem List   Diagnosis Date Noted   GAD (generalized anxiety disorder) 10/30/2016   Mild intermittent asthma 06/27/2016    Allergies:  Allergies  Allergen Reactions   Penicillins Hives    Medications:  Current Outpatient Medications:    loperamide (IMODIUM A-D) 2 MG tablet, Take 1 tablet (2 mg total) by mouth 4 (four) times daily as needed for diarrhea or loose stools. (Patient not taking: Reported on 08/06/2022), Disp: 30 tablet, Rfl: 0   metroNIDAZOLE (FLAGYL) 500 MG tablet, Take 1 tablet (500 mg total) by mouth 2 (two) times daily., Disp: 14 tablet, Rfl: 0   naproxen (NAPROSYN) 500 MG tablet, Take 1 tablet (500 mg total) by mouth 2 (two) times daily with a meal. (Patient not taking: Reported on 08/06/2022), Disp: 30 tablet, Rfl: 0   ondansetron (ZOFRAN-ODT) 4 MG disintegrating tablet, Take 1 tablet (4 mg total) by mouth every 8 (eight) hours as needed for nausea or vomiting. (Patient not taking: Reported on 08/06/2022), Disp: 20 tablet, Rfl: 0   Prenatal Vit-Fe Fumarate-FA (PRENATAL VITAMIN) 27-0.8 MG TABS, Take 1 tablet by mouth daily. (Patient not taking: Reported on 08/06/2022), Disp: 30 tablet, Rfl: 11  Observations/Objective: Patient is well-developed, well-nourished in no acute distress.  Resting comfortably  at home.  Head is normocephalic, atraumatic.  No labored breathing.  Speech is clear and coherent with logical content.  Patient is alert and oriented at baseline.    Assessment and Plan:  1. Pelvic pain  Advised patient to call OBGYN today, if unable to be seen in the next 24 hours advised Urgent Care for pregnancy testing, STI testing, vaginal exam and imaging as necessary after physical exam    [facility] office locations will be sent in AVS  Patient is agreeable to plan        Follow Up Instructions: I discussed the assessment and treatment plan with the patient. The patient was provided an opportunity to ask questions and all were answered. The patient agreed with the plan and demonstrated an understanding of the instructions.  A copy of instructions were sent to the patient via MyChart unless otherwise noted below.    The patient was advised to call back  or seek an in-person evaluation if the symptoms worsen or if the condition fails to improve as anticipated.  Time:  I spent 15 minutes with the patient via telehealth technology discussing the above problems/concerns.    Viviano Simas, FNP

## 2022-12-11 NOTE — ED Notes (Signed)
Pt complains of ovarian pain. Pt stated that in triage there must have been a misunderstanding and that she really just needs imaging done on her lower abdomin to rule out ovarian cysts.

## 2022-12-11 NOTE — Telephone Encounter (Signed)
Patient called stating she is having painful pelvic pain and have a knot on the left and right side,  also having chills and her body is really tender

## 2022-12-11 NOTE — ED Provider Triage Note (Signed)
Emergency Medicine Provider Triage Evaluation Note  Barbara Daniels , a 27 y.o. female  was evaluated in triage.  Pt complains of bumps on her vagina for the past 2 weeks.  Reports initially she had a day of painful urination around a week ago but then subsided so she never took the urinary tract infection medication that she was given by a virtual visit.  She is unsure if this is here bumps or something else is going on her vagina.  She also reports that she has some tender areas in her groin area as well.  She reports that she is not having some generalized achiness and chills but no recorded fevers.  She reports that she is not sexually active with anyone else but does not know if her partner for over a year is monogamous.  She denies history of genital herpes.  She is not having any belly pain.  No vaginal discharge.  No vaginal significant bleeding.  She reports that she is spotting but has an IUD which is common for her.  Review of Systems  Positive:  Negative:   Physical Exam  BP 120/70 (BP Location: Right Arm)   Pulse 72   Temp 98.6 F (37 C)   Resp 16   Ht 5\' 9"  (1.753 m)   Wt 126.6 kg   SpO2 100%   BMI 41.20 kg/m  Gen:   Awake, no distress   Resp:  Normal effort  MSK:   Moves extremities without difficulty  Other:  GU exam limited due to triage  Medical Decision Making  Medically screening exam initiated at 8:14 PM.  Appropriate orders placed.  [name] was informed that the remainder of the evaluation will be completed by another provider, this initial triage assessment does not replace that evaluation, and the importance of remaining in the ED until their evaluation is complete.  STD panel basic labs ordered.   Achille Rich, PA-C 12/11/22 2015

## 2022-12-12 LAB — RPR
RPR Ser Ql: REACTIVE — AB
RPR Titer: 1:1 {titer}

## 2022-12-12 NOTE — ED Notes (Signed)
Called x3 for room wih no response

## 2022-12-16 ENCOUNTER — Telehealth: Payer: Self-pay | Admitting: Family Medicine

## 2022-12-16 LAB — T.PALLIDUM AB, TOTAL: T Pallidum Abs: NONREACTIVE

## 2022-12-16 NOTE — Telephone Encounter (Signed)
Patient need someone to explain her test results

## 2022-12-18 LAB — GC/CHLAMYDIA PROBE AMP (~~LOC~~) NOT AT ARMC
Chlamydia: NEGATIVE
Comment: NEGATIVE
Comment: NORMAL
Neisseria Gonorrhea: NEGATIVE

## 2022-12-18 NOTE — Telephone Encounter (Addendum)
Returned call to patient. Reviewed labs with Dr. Crissie Reese and he reports that the lab results indicate it is a false positive result.   Reviewed her UA results are a little suspicious. ATB were prescribed 5 days prior. She did not take the medication as her symptoms went away. Reviewed based on UA, she may wish to take the ATB.  She is having abdominal cramps and swollen lymph nodes in the perineal areas. She has shaved recently. She did have some infected hair follicles and some ulcerative areas. She has not been tested for HSV, reviewed if she has a ulcer, she can have it tested to clarify if HSV.   Reviewed with patient to wipe front to back when cleaning perineum, voiding each time after intercourse. Reviewed shaving can increase chance of infection and lymph node involvement.   She reports she has more symptoms when stressed, reviewed that may be common. Reviewed trying a Probiotic to see if that will help her from getting repeated UTI's.   Patient will call back with any further questions or concerns.

## 2023-01-13 ENCOUNTER — Ambulatory Visit: Payer: Medicaid Other | Admitting: Obstetrics and Gynecology

## 2023-02-05 ENCOUNTER — Encounter: Payer: Self-pay | Admitting: Family Medicine

## 2023-04-27 ENCOUNTER — Encounter: Payer: Self-pay | Admitting: Family Medicine

## 2023-04-30 ENCOUNTER — Ambulatory Visit: Payer: Medicaid Other | Admitting: Obstetrics and Gynecology

## 2023-04-30 ENCOUNTER — Other Ambulatory Visit: Payer: Self-pay

## 2023-04-30 ENCOUNTER — Encounter: Payer: Self-pay | Admitting: Obstetrics and Gynecology

## 2023-04-30 VITALS — BP 126/84 | HR 72 | Wt 267.6 lb

## 2023-04-30 DIAGNOSIS — Z30431 Encounter for routine checking of intrauterine contraceptive device: Secondary | ICD-10-CM | POA: Diagnosis not present

## 2023-04-30 DIAGNOSIS — Z1331 Encounter for screening for depression: Secondary | ICD-10-CM

## 2023-04-30 DIAGNOSIS — Z975 Presence of (intrauterine) contraceptive device: Secondary | ICD-10-CM

## 2023-04-30 NOTE — Progress Notes (Signed)
GYNECOLOGY  VISIT   HPI: Barbara Daniels is a 28 y.o. G1P0101 here for IUD placement check.    Patient thinks IUD may have been displaced due to feeling the strings one day and not the next. Also thinks she may have felt the device in her vagina. She does not desire STI testing today.   She denies fever, chills, n/v, pain, vaginal bleeding, abnormal vaginal discharge or odor.     GYNECOLOGIC HISTORY: No LMP recorded. (Menstrual status: IUD). Contraception: IUD.   Last mammogram:  Not indicated Last pap smear:  Diagnosis  Date Value Ref Range Status  06/05/2021   Corrected   - Negative for intraepithelial lesion or malignancy (NILM)           OB History     Gravida  1   Para  1   Term      Preterm  1   AB      Living  1      SAB      IAB      Ectopic      Multiple  0   Live Births  1              Patient Active Problem List   Diagnosis Date Noted   GAD (generalized anxiety disorder) 10/30/2016   Mild intermittent asthma 06/27/2016    Past Medical History:  Diagnosis Date   Anxiety    Asthma    Depression    History of chickenpox    Hyperlipidemia     Past Surgical History:  Procedure Laterality Date   WISDOM TOOTH EXTRACTION      Current Outpatient Medications  Medication Sig Dispense Refill   loperamide (IMODIUM A-D) 2 MG tablet Take 1 tablet (2 mg total) by mouth 4 (four) times daily as needed for diarrhea or loose stools. (Patient not taking: Reported on 04/30/2023) 30 tablet 0   metroNIDAZOLE (FLAGYL) 500 MG tablet Take 1 tablet (500 mg total) by mouth 2 (two) times daily. (Patient not taking: Reported on 04/30/2023) 14 tablet 0   naproxen (NAPROSYN) 500 MG tablet Take 1 tablet (500 mg total) by mouth 2 (two) times daily with a meal. (Patient not taking: Reported on 04/30/2023) 30 tablet 0   ondansetron (ZOFRAN-ODT) 4 MG disintegrating tablet Take 1 tablet (4 mg total) by mouth every 8 (eight) hours as needed for nausea or vomiting. (Patient  not taking: Reported on 04/30/2023) 20 tablet 0   Prenatal Vit-Fe Fumarate-FA (PRENATAL VITAMIN) 27-0.8 MG TABS Take 1 tablet by mouth daily. (Patient not taking: Reported on 04/30/2023) 30 tablet 11   No current facility-administered medications for this visit.     ALLERGIES: Penicillins  Family History  Problem Relation Age of Onset   Diabetes Father    Hypertension Father    Diabetes Maternal Grandmother    Arthritis Paternal Grandmother    Heart failure Paternal Grandfather     Social History   Socioeconomic History   Marital status: Single    Spouse name: Not on file   Number of children: 0   Years of education: 12   Highest education level: Not on file  Occupational History   Occupation: Consulting civil engineer     Comment: Nursing   Tobacco Use   Smoking status: Never   Smokeless tobacco: Never  Vaping Use   Vaping status: Never Used  Substance and Sexual Activity   Alcohol use: No   Drug use: No   Sexual activity: Yes  Birth control/protection: None  Other Topics Concern   Not on file  Social History Narrative   Fun/Hobby: Sleep    Social Drivers of Health   Financial Resource Strain: Not on file  Food Insecurity: Food Insecurity Present (10/30/2021)   Hunger Vital Sign    Worried About Running Out of Food in the Last Year: Often true    Ran Out of Food in the Last Year: Sometimes true  Transportation Needs: No Transportation Needs (10/30/2021)   PRAPARE - Administrator, Civil Service (Medical): No    Lack of Transportation (Non-Medical): No  Physical Activity: Not on file  Stress: Not on file  Social Connections: Not on file  Intimate Partner Violence: Not on file    Review of Systems  PHYSICAL EXAMINATION:    BP 126/84   Pulse 72   Wt 267 lb 9.6 oz (121.4 kg)   BMI 39.52 kg/m     General appearance: alert, cooperative and appears stated age Head: Normocephalic, without obvious abnormality, atraumatic Lungs: clear to auscultation  bilaterally Heart: regular rate and rhythm Abdomen: soft, non-tender, no masses,  no organomegaly Extremities: extremities normal, atraumatic, no cyanosis or edema Skin: Skin color, texture, turgor normal. No rashes or lesions Lymph nodes: Inguinal lymph nodes normal. No abnormal inguinal nodes palpated Neurologic: Grossly normal  Pelvic: External genitalia:  no lesions              Urethra:  normal appearing urethra with no masses, tenderness or lesions              Bartholins and Skenes: normal                 Vagina: normal appearing vagina with normal color and discharge, no lesions              Cervix: no lesions. +IUD strings tucked proximally into left fornix.                 Chaperone was present for exam  ASSESSMENT & PLAN  -IUD visualized at uterine fundus with POC [place]. -Strings visualized, tucked around left fornix.  -Patient reassured and advised to RTC with any further concerns.   An After Visit Summary was printed and given to the patient.  [address], [place] 1/16/20255:41 PM

## 2023-09-17 ENCOUNTER — Ambulatory Visit (HOSPITAL_COMMUNITY)
Admission: RE | Admit: 2023-09-17 | Discharge: 2023-09-17 | Disposition: A | Discharge status: Home / Self Care | Source: Ambulatory Visit | Attending: Internal Medicine | Admitting: Internal Medicine

## 2023-09-17 ENCOUNTER — Encounter (HOSPITAL_COMMUNITY): Payer: Self-pay

## 2023-09-17 VITALS — BP 118/79 | HR 58 | Temp 98.4°F | Resp 14

## 2023-09-17 DIAGNOSIS — J028 Acute pharyngitis due to other specified organisms: Secondary | ICD-10-CM

## 2023-09-17 LAB — POCT RAPID STREP A (OFFICE): Rapid Strep A Screen: NEGATIVE

## 2023-09-17 MED ORDER — AZITHROMYCIN 250 MG PO TABS: ORAL_TABLET | ORAL | 0 refills | Status: AC

## 2023-09-17 MED ORDER — PREDNISONE 20 MG PO TABS
40.0000 mg | ORAL_TABLET | Freq: Every day | ORAL | 0 refills | Status: AC
Start: 2023-09-17 — End: 2023-09-22

## 2023-09-17 NOTE — ED Provider Notes (Signed)
 [facility]    CSN: [id number] Arrival date & time: 09/17/23  1604      History   Chief Complaint Chief Complaint  Patient presents with   Sore Throat    Entered by patient    HPI Barbara Daniels is a 28 y.o. female.     Last Tuesday went to her moms house drank out of a jug that might have been drank by her family kids. Started feeling bad after that.    Sore Throat Pertinent negatives include no chest pain, no abdominal pain and no shortness of breath.    Past Medical History:  Diagnosis Date   Anxiety    Asthma    Depression    History of chickenpox    Hyperlipidemia     Patient Active Problem List   Diagnosis Date Noted   GAD (generalized anxiety disorder) 10/30/2016   Mild intermittent asthma 06/27/2016    Past Surgical History:  Procedure Laterality Date   WISDOM TOOTH EXTRACTION      OB History     Gravida  1   Para  1   Term      Preterm  1   AB      Living  1      SAB      IAB      Ectopic      Multiple  0   Live Births  1            Home Medications    Prior to Admission medications   Medication Sig Start Date End Date Taking? Authorizing Provider  azithromycin  (ZITHROMAX ) 250 MG tablet Take first 2 tablets together, then 1 every day until finished. 09/17/23  Yes Maryum Batterson A, PA-C  predniSONE (DELTASONE) 20 MG tablet Take 2 tablets (40 mg total) by mouth daily with breakfast for 5 days. 09/17/23 09/22/23 Yes Yakov Bergen A, PA-C  loperamide  (IMODIUM  A-D) 2 MG tablet Take 1 tablet (2 mg total) by mouth 4 (four) times daily as needed for diarrhea or loose stools. Patient not taking: Reported on 04/30/2023 02/12/22   Angelia Kelp, PA-C  metroNIDAZOLE  (FLAGYL ) 500 MG tablet Take 1 tablet (500 mg total) by mouth 2 (two) times daily. Patient not taking: Reported on 04/30/2023 08/14/22   Tresia Fruit, MD  naproxen  (NAPROSYN ) 500 MG tablet Take 1 tablet (500 mg total) by mouth 2 (two) times daily with a  meal. Patient not taking: Reported on 04/30/2023 02/12/22   Angelia Kelp, PA-C  ondansetron  (ZOFRAN -ODT) 4 MG disintegrating tablet Take 1 tablet (4 mg total) by mouth every 8 (eight) hours as needed for nausea or vomiting. Patient not taking: Reported on 04/30/2023 02/12/22   Angelia Kelp, PA-C  Prenatal Vit-Fe Fumarate-FA (PRENATAL VITAMIN) 27-0.8 MG TABS Take 1 tablet by mouth daily. Patient not taking: Reported on 04/30/2023 03/19/22   Teena Feast, MD    Family History Family History  Problem Relation Age of Onset   Diabetes Father    Hypertension Father    Diabetes Maternal Grandmother    Arthritis Paternal Grandmother    Heart failure Paternal Grandfather     Social History Social History   Tobacco Use   Smoking status: Never   Smokeless tobacco: Never  Vaping Use   Vaping status: Never Used  Substance Use Topics   Alcohol use: No   Drug use: No     Allergies   Penicillins   Review of Systems Review of  Systems  Constitutional:  Positive for fatigue. Negative for chills and fever.  HENT:  Positive for sore throat and trouble swallowing. Negative for ear pain.   Eyes:  Negative for pain and visual disturbance.  Respiratory:  Positive for cough. Negative for shortness of breath.   Cardiovascular:  Negative for chest pain and palpitations.  Gastrointestinal:  Positive for nausea. Negative for abdominal pain, constipation, diarrhea and vomiting.  Genitourinary:  Negative for dysuria and hematuria.  Musculoskeletal:  Negative for arthralgias and back pain.  Skin:  Negative for color change and rash.  Neurological:  Negative for seizures and syncope.  All other systems reviewed and are negative.    Physical Exam Triage Vital Signs ED Triage Vitals [09/17/23 1614]  Encounter Vitals Group     BP 118/79     Systolic BP Percentile      Diastolic BP Percentile      Pulse Rate (!) 58     Resp 14     Temp 98.4 F (36.9 C)     Temp Source Oral      SpO2 96 %     Weight      Height      Head Circumference      Peak Flow      Pain Score 7     Pain Loc      Pain Education      Exclude from Growth Chart    No data found.  Updated Vital Signs BP 118/79 (BP Location: Left Arm)   Pulse (!) 58   Temp 98.4 F (36.9 C) (Oral)   Resp 14   SpO2 96%   Visual Acuity Right Eye Distance:   Left Eye Distance:   Bilateral Distance:    Right Eye Near:   Left Eye Near:    Bilateral Near:     Physical Exam Vitals and nursing note reviewed.  Constitutional:      General: She is not in acute distress.    Appearance: She is well-developed.  HENT:     Head: Normocephalic and atraumatic.     Right Ear: Tympanic membrane normal.     Left Ear: Tympanic membrane normal.     Ears:     Comments: Very minimal clear fluid at base of TMs    Nose: No congestion.     Mouth/Throat:     Mouth: Mucous membranes are moist.     Pharynx: Pharyngeal swelling, oropharyngeal exudate and posterior oropharyngeal erythema present.  Eyes:     Conjunctiva/sclera: Conjunctivae normal.  Cardiovascular:     Rate and Rhythm: Normal rate and regular rhythm.     Heart sounds: No murmur heard. Pulmonary:     Effort: Pulmonary effort is normal. No respiratory distress.     Breath sounds: Normal breath sounds.  Abdominal:     Palpations: Abdomen is soft.     Tenderness: There is no abdominal tenderness.  Musculoskeletal:        General: No swelling.     Cervical back: Neck supple.  Skin:    General: Skin is warm and dry.     Capillary Refill: Capillary refill takes less than 2 seconds.  Neurological:     Mental Status: She is alert.  Psychiatric:        Mood and Affect: Mood normal.      UC Treatments / Results  Labs (all labs ordered are listed, but only abnormal results are displayed) Labs Reviewed  POCT RAPID STREP A (OFFICE) -  Normal    EKG   Radiology No results found.  Procedures Procedures (including critical care  time)  Medications Ordered in UC Medications - No data to display  Initial Impression / Assessment and Plan / UC Course  I have reviewed the triage vital signs and the nursing notes.  Pertinent labs & imaging results that were available during my care of the patient were reviewed by me and considered in my medical decision making (see chart for details).     Acute pharyngitis due to other specified organisms   Strep testing done today is negative. Symptoms, physical exam findings(exudate, swelling) and duration of symptoms (1.5 weeks) are most consistent with a bacterial infection. We will treat with the following:  Azithromycin  250mg  Take 2 tablets today and the 1 tablet daily for 4 more days. Prednisone 40 mg (2 tablets) once daily for 5 days. Take this in the morning.  This is a steroid to help with inflammation and pain.  Do not take ibuprofen  while you are taking prednisone.  Okay to take Tylenol  as needed for pain or fevers Drink plenty of fluids and stay hydrated.  Drink fluids even if you do not feel like eating solid food Return to urgent care or PCP if symptoms worsen or fail to resolve.    Final Clinical Impressions(s) / UC Diagnoses   Final diagnoses:  Acute pharyngitis due to other specified organisms     Discharge Instructions      Strep testing done today is negative. Symptoms, physical exam findings and duration of symptoms are most consistent with a bacterial infection. We will treat with the following:  Azithromycin  250mg  Take 2 tablets today and the 1 tablet daily for 4 more days. Prednisone 40 mg (2 tablets) once daily for 5 days. Take this in the morning.  This is a steroid to help with inflammation and pain.  Do not take ibuprofen  while you are taking prednisone.  Okay to take Tylenol  as needed for pain or fevers Drink plenty of fluids and stay hydrated.  Drink fluids even if you do not feel like eating solid food Return to urgent care or PCP if symptoms  worsen or fail to resolve.     ED Prescriptions     Medication Sig Dispense Auth. Provider   azithromycin  (ZITHROMAX ) 250 MG tablet Take first 2 tablets together, then 1 every day until finished. 6 tablet Khloey Chern A, PA-C   predniSONE (DELTASONE) 20 MG tablet Take 2 tablets (40 mg total) by mouth daily with breakfast for 5 days. 10 tablet Kreg Pesa, [place]      PDMP not reviewed this encounter.   Kreg Pesa, [place] 09/17/23 1659

## 2023-09-17 NOTE — ED Triage Notes (Signed)
 Patient reports that she has had a sore throat (L>R) x 1 1/2 weeks ago. Patient reports intermittent nausea and cough.  Patient states she has been taking Tylenol  for pain.

## 2023-09-17 NOTE — Discharge Instructions (Addendum)
 Strep testing done today is negative. Symptoms, physical exam findings and duration of symptoms are most consistent with a bacterial infection. We will treat with the following:  Azithromycin  250mg  Take 2 tablets today and the 1 tablet daily for 4 more days. Prednisone 40 mg (2 tablets) once daily for 5 days. Take this in the morning.  This is a steroid to help with inflammation and pain.  Do not take ibuprofen  while you are taking prednisone.  Okay to take Tylenol  as needed for pain or fevers Drink plenty of fluids and stay hydrated.  Drink fluids even if you do not feel like eating solid food Return to urgent care or PCP if symptoms worsen or fail to resolve.

## 2023-09-30 NOTE — Progress Notes (Deleted)
 Subjective:     Patient ID: Barbara Daniels, female    DOB: [birth year], 28 y.o.   MRN: [id number]  No chief complaint on file.   HPI    Barbara Daniels a 28 y.o.  female/female***presents today for a complete physical exam. Pt reports consuming a {diet types:17450} diet. {types:19826} Pt generally feels {DESC; WELL/FAIRLY WELL/POORLY:18703}. Reports sleeping {DESC; WELL/FAIRLY WELL/POORLY:18703}.  {does/does not:200015} have additional problems to discuss today. Barbara Daniels has a past medical history of asthma, hyperlipidemia, depression, anxiety.  New Surgeries or Family Hx: ***Yes/ Denies Habits: ETOH, illicit drugs, smoking, secondhand exposure, caffeine     From ****   Relationship:                 Employed:         Employer:  Resides with:  Education: (highest level):  Records Obtained: Yes OR No records today, In process***  Prior PCP:  PMHx:*** (PMH:  CA, CVD, HTN, CVA, DM, ETOH, Thyroid )***  FH: CA, MI/CAD, HTN, CVA, DM, ETOH, Mental Illness, Thyroid  Dz  PSx-( Tubal Lititagion, Cholesctectmy, appendectomy)?  LMP:  Date-*** ;Reports regular menstrual cycles.   Habits: ETOH:  Y/N   # Drinks per week         SA: Yes/ N/A ***            Smoking:  (Y/N)  ***PPD  Allergies: PCN  Mild asthma Well controlled. Albuterol  PRN**** using****?  GAD Reports stable mood. No current medications.  [facility] - Pap- 05/2021- Negative HPV and Cytology. Repeat 3 years in 2026. Followed by GYN - Immunizations: HPV, COVID-19, PNA due  Patient denies fever, chills, SOB, CP, palpitations, dyspnea, edema, HA, vision changes, N/V/D, abdominal pain, urinary symptoms, rash, weight changes, and recent illness or hospitalizations.       History of Present Illness             Health Maintenance Due  Topic Date Due   HPV VACCINES (1 - 3-dose series) Never done   Pneumococcal Vaccine 29-59 Years old (1 of 2 - PCV) Never done   Hepatitis B Vaccines (1 of 3 - 19+ 3-dose series) Never done    COVID-19 Vaccine (4 - 2024-25 season) 12/14/2022    Past Medical History:  Diagnosis Date   Anxiety    Asthma    Depression    History of chickenpox    Hyperlipidemia     Past Surgical History:  Procedure Laterality Date   WISDOM TOOTH EXTRACTION      Family History  Problem Relation Age of Onset   Diabetes Father    Hypertension Father    Diabetes Maternal Grandmother    Arthritis Paternal Grandmother    Heart failure Paternal Grandfather     Social History   Socioeconomic History   Marital status: Single    Spouse name: Not on file   Number of children: 0   Years of education: 12   Highest education level: Not on file  Occupational History   Occupation: Consulting civil engineer     Comment: Nursing   Tobacco Use   Smoking status: Never   Smokeless tobacco: Never  Vaping Use   Vaping status: Never Used  Substance and Sexual Activity   Alcohol use: No   Drug use: No   Sexual activity: Yes    Birth control/protection: None  Other Topics Concern   Not on file  Social History Narrative   Fun/Hobby: Sleep    Social Drivers of Dispensing optician  Resource Strain: Not on file  Food Insecurity: Food Insecurity Present (10/30/2021)   Hunger Vital Sign    Worried About Running Out of Food in the Last Year: Often true    Ran Out of Food in the Last Year: Sometimes true  Transportation Needs: No Transportation Needs (10/30/2021)   PRAPARE - Administrator, Civil Service (Medical): No    Lack of Transportation (Non-Medical): No  Physical Activity: Not on file  Stress: Not on file  Social Connections: Not on file  Intimate Partner Violence: Not on file    Outpatient Medications Prior to Visit  Medication Sig Dispense Refill   azithromycin  (ZITHROMAX ) 250 MG tablet Take first 2 tablets together, then 1 every day until finished. 6 tablet 0   loperamide  (IMODIUM  A-D) 2 MG tablet Take 1 tablet (2 mg total) by mouth 4 (four) times daily as needed for diarrhea or loose  stools. (Patient not taking: Reported on 04/30/2023) 30 tablet 0   metroNIDAZOLE  (FLAGYL ) 500 MG tablet Take 1 tablet (500 mg total) by mouth 2 (two) times daily. (Patient not taking: Reported on 04/30/2023) 14 tablet 0   naproxen  (NAPROSYN ) 500 MG tablet Take 1 tablet (500 mg total) by mouth 2 (two) times daily with a meal. (Patient not taking: Reported on 04/30/2023) 30 tablet 0   ondansetron  (ZOFRAN -ODT) 4 MG disintegrating tablet Take 1 tablet (4 mg total) by mouth every 8 (eight) hours as needed for nausea or vomiting. (Patient not taking: Reported on 04/30/2023) 20 tablet 0   Prenatal Vit-Fe Fumarate-FA (PRENATAL VITAMIN) 27-0.8 MG TABS Take 1 tablet by mouth daily. (Patient not taking: Reported on 04/30/2023) 30 tablet 11   No facility-administered medications prior to visit.    Allergies  Allergen Reactions   Penicillins Hives    ROS   See HPI Objective:    Physical Exam  General: No acute distress. Awake and conversant.  Eyes: Normal conjunctiva, anicteric. Round symmetric pupils.  ENT: Hearing grossly intact. No nasal discharge.  Neck: Neck is supple. No masses or thyromegaly.  Respiratory: CTAB. Respirations are non-labored. No wheezing.  Skin: Warm. No rashes or ulcers.  Psych: Alert and oriented. Cooperative, Appropriate mood and affect, Normal judgment.  CV: RRR. No murmur. No lower extremity edema.  MSK: Normal ambulation. No clubbing or cyanosis.  Neuro:  CN II-XII grossly normal.    There were no vitals taken for this visit. Wt Readings from Last 3 Encounters:  04/30/23 267 lb 9.6 oz (121.4 kg)  12/11/22 279 lb (126.6 kg)  10/09/22 279 lb (126.6 kg)       Assessment & Plan:   Problem List Items Addressed This Visit     Annual visit for general adult medical examination without abnormal findings   Family history of diabetes mellitus (DM)   GAD (generalized anxiety disorder)   Hyperlipidemia   Mild intermittent asthma   Preventative health care - Primary    [facility] - Pap- 05/2021- Negative HPV and Cytology. Repeat 3 years. Followed by GYN. -Immunizations: PNA due. Denies today.       Barbara Daniels maintain her naproxen , ondansetron , loperamide , Prenatal Vitamin, metroNIDAZOLE , and azithromycin .  No orders of the defined types were placed in this encounter.

## 2023-09-30 NOTE — Patient Instructions (Signed)

## 2023-10-02 DIAGNOSIS — Z0000 Encounter for general adult medical examination without abnormal findings: Secondary | ICD-10-CM | POA: Insufficient documentation

## 2023-10-02 NOTE — Assessment & Plan Note (Signed)
 [facility] - Pap- 05/2021- Negative HPV and Cytology. Repeat 3 years. Followed by GYN. -Immunizations: PNA due. Denies today.

## 2023-10-06 ENCOUNTER — Encounter: Payer: Self-pay | Admitting: Student

## 2023-10-06 DIAGNOSIS — E785 Hyperlipidemia, unspecified: Secondary | ICD-10-CM | POA: Insufficient documentation

## 2023-10-06 DIAGNOSIS — Z833 Family history of diabetes mellitus: Secondary | ICD-10-CM | POA: Insufficient documentation

## 2023-10-06 NOTE — Assessment & Plan Note (Signed)
 Patient encouraged to maintain heart healthy diet, regular exercise, adequate sleep. prescribed.

## 2023-10-06 NOTE — Assessment & Plan Note (Signed)
Albuterol prn only. No recent flares or within the last year.

## 2023-10-07 ENCOUNTER — Ambulatory Visit (INDEPENDENT_AMBULATORY_CARE_PROVIDER_SITE_OTHER): Admitting: Student

## 2023-10-07 DIAGNOSIS — F411 Generalized anxiety disorder: Secondary | ICD-10-CM

## 2023-10-07 DIAGNOSIS — Z833 Family history of diabetes mellitus: Secondary | ICD-10-CM

## 2023-10-07 DIAGNOSIS — J4520 Mild intermittent asthma, uncomplicated: Secondary | ICD-10-CM

## 2023-10-07 DIAGNOSIS — Z0000 Encounter for general adult medical examination without abnormal findings: Secondary | ICD-10-CM

## 2023-10-07 DIAGNOSIS — E785 Hyperlipidemia, unspecified: Secondary | ICD-10-CM

## 2023-11-05 NOTE — Progress Notes (Signed)
 Pt No Show- Chart Open in Error.

## 2024-02-14 ENCOUNTER — Encounter (HOSPITAL_COMMUNITY): Payer: Self-pay

## 2024-02-14 ENCOUNTER — Emergency Department (HOSPITAL_COMMUNITY)

## 2024-02-14 ENCOUNTER — Other Ambulatory Visit: Payer: Self-pay

## 2024-02-14 ENCOUNTER — Emergency Department (HOSPITAL_COMMUNITY)
Admission: EM | Admit: 2024-02-14 | Discharge: 2024-02-14 | Disposition: A | Discharge status: Home / Self Care | Attending: Emergency Medicine | Admitting: Emergency Medicine

## 2024-02-14 DIAGNOSIS — J45909 Unspecified asthma, uncomplicated: Secondary | ICD-10-CM | POA: Diagnosis not present

## 2024-02-14 DIAGNOSIS — R519 Headache, unspecified: Secondary | ICD-10-CM | POA: Diagnosis present

## 2024-02-14 DIAGNOSIS — G43809 Other migraine, not intractable, without status migrainosus: Secondary | ICD-10-CM | POA: Insufficient documentation

## 2024-02-14 LAB — CBC WITH DIFFERENTIAL/PLATELET
Abs Immature Granulocytes: 0.02 K/uL (ref 0.00–0.07)
Basophils Absolute: 0 K/uL (ref 0.0–0.1)
Basophils Relative: 1 %
Eosinophils Absolute: 0 K/uL (ref 0.0–0.5)
Eosinophils Relative: 0 %
HCT: 41.1 % (ref 36.0–46.0)
Hemoglobin: 14.3 g/dL (ref 12.0–15.0)
Immature Granulocytes: 0 %
Lymphocytes Relative: 29 %
Lymphs Abs: 2 K/uL (ref 0.7–4.0)
MCH: 30.9 pg (ref 26.0–34.0)
MCHC: 34.8 g/dL (ref 30.0–36.0)
MCV: 88.8 fL (ref 80.0–100.0)
Monocytes Absolute: 0.6 K/uL (ref 0.1–1.0)
Monocytes Relative: 9 %
Neutro Abs: 4.1 K/uL (ref 1.7–7.7)
Neutrophils Relative %: 61 %
Platelets: 279 K/uL (ref 150–400)
RBC: 4.63 MIL/uL (ref 3.87–5.11)
RDW: 12.1 % (ref 11.5–15.5)
WBC: 6.8 K/uL (ref 4.0–10.5)
nRBC: 0 % (ref 0.0–0.2)

## 2024-02-14 LAB — COMPREHENSIVE METABOLIC PANEL WITH GFR
ALT: 18 U/L (ref 0–44)
AST: 17 U/L (ref 15–41)
Albumin: 3.9 g/dL (ref 3.5–5.0)
Alkaline Phosphatase: 52 U/L (ref 38–126)
Anion gap: 11 (ref 5–15)
BUN: 9 mg/dL (ref 6–20)
CO2: 23 mmol/L (ref 22–32)
Calcium: 8.9 mg/dL (ref 8.9–10.3)
Chloride: 101 mmol/L (ref 98–111)
Creatinine, Ser: 0.84 mg/dL (ref 0.44–1.00)
GFR, Estimated: >60 mL/min (ref >60)
Glucose, Bld: 107 mg/dL — ABNORMAL HIGH (ref 70–99)
Potassium: 3.6 mmol/L (ref 3.5–5.1)
Sodium: 135 mmol/L (ref 135–145)
Total Bilirubin: 1.1 mg/dL (ref 0.0–1.2)
Total Protein: 7.4 g/dL (ref 6.5–8.1)

## 2024-02-14 LAB — HCG, SERUM, QUALITATIVE: Preg, Serum: NEGATIVE

## 2024-02-14 MED ORDER — METOCLOPRAMIDE HCL 5 MG/ML IJ SOLN
10.0000 mg | Freq: Once | INTRAMUSCULAR | Status: AC
  Administered 2024-02-14: 10 mg via INTRAVENOUS
  Filled 2024-02-14: qty 2

## 2024-02-14 MED ORDER — KETOROLAC TROMETHAMINE 30 MG/ML IJ SOLN
30.0000 mg | Freq: Once | INTRAMUSCULAR | Status: AC
  Administered 2024-02-14: 30 mg via INTRAVENOUS
  Filled 2024-02-14: qty 1

## 2024-02-14 MED ORDER — SUMATRIPTAN SUCCINATE 50 MG PO TABS: 50.0000 mg | ORAL_TABLET | Freq: Once | ORAL | 0 refills | Status: AC

## 2024-02-14 NOTE — ED Triage Notes (Signed)
 Patient states she has a migraine with nausea and vomiting. She states it started yesterday afternoon. She states she is having visual disturbances.   No history if migraines, but her mother has them because she has a aneurysm

## 2024-02-14 NOTE — ED Provider Triage Note (Signed)
 Emergency Medicine Provider Triage Evaluation Note  Barbara Daniels , a 28 y.o. female  was evaluated in triage.  Pt complains of headache for about 20 hours associate with nausea vomiting and visual sensitivity.  Review of Systems  Positive: HA Negative: Focal deficit  Physical Exam  BP 135/81 (BP Location: Right Arm)   Pulse 81   Temp 98.1 F (36.7 C) (Oral)   Resp 17   SpO2 100%  Gen:   Awake, no distress   Resp:  Normal effort  MSK:   Moves extremities without difficulty  Other:    Medical Decision Making  Medically screening exam initiated at 3:00 PM.  Appropriate orders placed.  Barbara Daniels was informed that the remainder of the evaluation will be completed by another provider, this initial triage assessment does not replace that evaluation, and the importance of remaining in the ED until their evaluation is complete.     Barbara Warren SAILOR, PA-C 02/14/24 1501

## 2024-02-14 NOTE — ED Provider Notes (Signed)
 [place] EMERGENCY DEPARTMENT AT [facility] Provider Note   CSN: [id number] Arrival date & time: 02/14/24  1439     Patient presents with: Migraine   Barbara Daniels is a 28 y.o. female.  {Add pertinent medical, surgical, social history, OB history to HPI:32947}  Migraine     Patient has a history of asthma hyperlipidemia anxiety.  She presents ED with complaints of headache.  Patient states she started having symptoms yesterday.  The headaches behind her eye.  She has had some nausea or vomiting.  She has had some photophobia.  Patient states she is also had some palpitations and feeling of anxiety during the episode.  She denies any focal numbness or weakness.  No fevers or chills.  Her mother has headaches but patient has not had these before.  She thinks it could be a migraine  Prior to Admission medications   Medication Sig Start Date End Date Taking? Authorizing Provider  azithromycin  (ZITHROMAX ) 250 MG tablet Take first 2 tablets together, then 1 every day until finished. 09/17/23   White, Elizabeth A, PA-C  loperamide  (IMODIUM  A-D) 2 MG tablet Take 1 tablet (2 mg total) by mouth 4 (four) times daily as needed for diarrhea or loose stools. Patient not taking: Reported on 04/30/2023 02/12/22   Vivienne Delon HERO, PA-C  metroNIDAZOLE  (FLAGYL ) 500 MG tablet Take 1 tablet (500 mg total) by mouth 2 (two) times daily. Patient not taking: Reported on 04/30/2023 08/14/22   Eveline Lynwood MATSU, MD  naproxen  (NAPROSYN ) 500 MG tablet Take 1 tablet (500 mg total) by mouth 2 (two) times daily with a meal. Patient not taking: Reported on 04/30/2023 02/12/22   Vivienne Delon HERO, PA-C  ondansetron  (ZOFRAN -ODT) 4 MG disintegrating tablet Take 1 tablet (4 mg total) by mouth every 8 (eight) hours as needed for nausea or vomiting. Patient not taking: Reported on 04/30/2023 02/12/22   Vivienne Delon HERO, PA-C  Prenatal Vit-Fe Fumarate-FA (PRENATAL VITAMIN) 27-0.8 MG TABS Take 1 tablet by mouth  daily. Patient not taking: Reported on 04/30/2023 03/19/22   Lola Donnice HERO, MD    Allergies: Penicillins    Review of Systems  Updated Vital Signs BP 135/81 (BP Location: Right Arm)   Pulse 81   Temp 98.1 F (36.7 C) (Oral)   Resp 17   SpO2 100%   Physical Exam Vitals and nursing note reviewed.  Constitutional:      General: She is not in acute distress.    Appearance: She is well-developed.  HENT:     Head: Normocephalic and atraumatic.     Right Ear: External ear normal.     Left Ear: External ear normal.  Eyes:     General: No visual field deficit or scleral icterus.       Right eye: No discharge.        Left eye: No discharge.     Conjunctiva/sclera: Conjunctivae normal.  Neck:     Trachea: No tracheal deviation.  Cardiovascular:     Rate and Rhythm: Normal rate and regular rhythm.  Pulmonary:     Effort: Pulmonary effort is normal. No respiratory distress.     Breath sounds: Normal breath sounds. No stridor. No wheezing or rales.  Abdominal:     General: Bowel sounds are normal. There is no distension.     Palpations: Abdomen is soft.     Tenderness: There is no abdominal tenderness. There is no guarding or rebound.  Musculoskeletal:  General: No tenderness.     Cervical back: Neck supple.  Skin:    General: Skin is warm and dry.     Findings: No rash.  Neurological:     Mental Status: She is alert and oriented to person, place, and time.     Cranial Nerves: No cranial nerve deficit, dysarthria or facial asymmetry.     Sensory: No sensory deficit.     Motor: No abnormal muscle tone, seizure activity or pronator drift.     Coordination: Coordination normal.     Comments:  able to hold both legs off bed for 5 seconds, sensation intact in all extremities,  no left or right sided neglect,  no nystagmus noted   Psychiatric:        Mood and Affect: Mood normal.     (all labs ordered are listed, but only abnormal results are displayed) Labs Reviewed   COMPREHENSIVE METABOLIC PANEL WITH GFR - Abnormal; Notable for the following components:      Result Value   Glucose, Bld 107 (*)    All other components within normal limits  CBC WITH DIFFERENTIAL/PLATELET  HCG, SERUM, QUALITATIVE    EKG: None  Radiology: CT HEAD WO CONTRAST Result Date: 02/14/2024 EXAM: CT HEAD WITHOUT CONTRAST 02/14/2024 03:32:00 PM TECHNIQUE: CT of the head was performed without the administration of intravenous contrast. Automated exposure control, iterative reconstruction, and/or weight based adjustment of the mA/kV was utilized to reduce the radiation dose to as low as reasonably achievable. COMPARISON: None available. CLINICAL HISTORY: Headache, increasing frequency or severity. FINDINGS: BRAIN AND VENTRICLES: There is no evidence of an acute infarct, intracranial hemorrhage, mass, midline shift, hydrocephalus, or extra-axial fluid collection. Cerebral volume is normal. A partially empty sella is noted. There is no cerebellar tonsillar ectopia. ORBITS: No acute abnormality. SINUSES: No acute abnormality. SOFT TISSUES AND SKULL: No acute soft tissue abnormality. No skull fracture. IMPRESSION: 1. No acute intracranial abnormality. 2. Partially empty sella, often reflecting normal anatomic variation though can also be seen with idiopathic intracranial hypertension. Electronically signed by: Dasie Hamburg MD 02/14/2024 03:47 PM EST RP Workstation: [id number]    {Document cardiac monitor, telemetry assessment procedure when appropriate:32947} Procedures   Medications Ordered in the ED  metoCLOPramide (REGLAN) injection 10 mg (has no administration in time range)  ketorolac  (TORADOL ) 30 MG/ML injection 30 mg (has no administration in time range)      {Click here for ABCD2, HEART and other calculators REFRESH Note before signing:1}                              Medical Decision Making Risk Prescription drug management.   ***  {Document critical care time when  appropriate  Document review of labs and clinical decision tools ie CHADS2VASC2, etc  Document your independent review of radiology images and any outside records  Document your discussion with family members, caretakers and with consultants  Document social determinants of health affecting pt's care  Document your decision making why or why not admission, treatments were needed:32947:::1}   Final diagnoses:  None    ED Discharge Orders     None

## 2024-02-14 NOTE — Discharge Instructions (Signed)
 Take the medications as needed for recurrent headache.  Follow-up with your primary care doctor to be rechecked as we discussed.  Return to the ER for fevers, chills, weakness or other concerning symptoms.

## 2024-02-14 NOTE — ED Notes (Signed)
 CT said pt

## 2024-02-15 ENCOUNTER — Emergency Department (HOSPITAL_COMMUNITY)
Admission: EM | Admit: 2024-02-15 | Discharge: 2024-02-15 | Disposition: A | Attending: Emergency Medicine | Admitting: Emergency Medicine

## 2024-02-15 ENCOUNTER — Encounter (HOSPITAL_COMMUNITY): Payer: Self-pay | Admitting: Emergency Medicine

## 2024-02-15 ENCOUNTER — Other Ambulatory Visit: Payer: Self-pay

## 2024-02-15 DIAGNOSIS — G43909 Migraine, unspecified, not intractable, without status migrainosus: Secondary | ICD-10-CM | POA: Insufficient documentation

## 2024-02-15 MED ORDER — MAGNESIUM SULFATE 2 GM/50ML IV SOLN
2.0000 g | Freq: Once | INTRAVENOUS | Status: AC
  Administered 2024-02-15: 2 g via INTRAVENOUS
  Filled 2024-02-15: qty 50

## 2024-02-15 MED ORDER — DEXAMETHASONE SOD PHOSPHATE PF 10 MG/ML IJ SOLN
10.0000 mg | Freq: Once | INTRAMUSCULAR | Status: AC
  Administered 2024-02-15: 10 mg via INTRAVENOUS

## 2024-02-15 MED ORDER — METHOCARBAMOL 500 MG PO TABS: 500.0000 mg | ORAL_TABLET | Freq: Two times a day (BID) | ORAL | 0 refills | Status: AC

## 2024-02-15 MED ORDER — KETOROLAC TROMETHAMINE 15 MG/ML IJ SOLN
15.0000 mg | Freq: Once | INTRAMUSCULAR | Status: AC
  Administered 2024-02-15: 15 mg via INTRAVENOUS
  Filled 2024-02-15: qty 1

## 2024-02-15 MED ORDER — OXYCODONE-ACETAMINOPHEN 5-325 MG PO TABS
1.0000 | ORAL_TABLET | ORAL | Status: DC | PRN
  Administered 2024-02-15: 1 via ORAL
  Filled 2024-02-15: qty 1

## 2024-02-15 MED ORDER — METHOCARBAMOL 500 MG PO TABS
750.0000 mg | ORAL_TABLET | Freq: Once | ORAL | Status: AC
  Administered 2024-02-15: 750 mg via ORAL
  Filled 2024-02-15: qty 2

## 2024-02-15 MED ORDER — SODIUM CHLORIDE 0.9 % IV BOLUS
1000.0000 mL | Freq: Once | INTRAVENOUS | Status: AC
  Administered 2024-02-15: 1000 mL via INTRAVENOUS

## 2024-02-15 NOTE — ED Notes (Signed)
 Pt ambulated to restroom.

## 2024-02-15 NOTE — Discharge Instructions (Signed)
 Take Robaxin  as needed as prescribed for the muscle pain in your neck.  Do not drive or operate machinery while taking this medication. Please follow-up with neurology, call to schedule an appointment. Recheck with your primary care provider in 2 days.  If you do not have a primary care provider, please contact [place] and wellness. Return to the emergency room for severe concerning symptoms.

## 2024-02-15 NOTE — ED Provider Notes (Signed)
 [place] EMERGENCY DEPARTMENT AT [facility] Provider Note   CSN: [id number] Arrival date & time: 02/15/24  0010     Patient presents with: Migraine   Barbara Daniels is a 28 y.o. female.   28 year old female presents with a headache. Symptoms started on Saturday, states headache started and became progressively worse, no improvement with OTC pain reliever. 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 Patient came to the ER, had a head CT and provided with Toradol  and Reglan with some improvement in pain and was dc home. She states she was dc with Imitrex rx, went to the pharmacy and picked this up, tried it without much improvement in her head pain. Patient had persistent headache last night, called 911 and was brought to the ER. She was provided with percocet in triage and states the sharp head pains have stopped, has persistent pressure in her eyes and pain in the back of her neck.  No history of migraines, has had occasional headaches but not to this extent. No changes in speech, gait, vision, no unilateral weakness or numbness.        Prior to Admission medications   Medication Sig Start Date End Date Taking? Authorizing Provider  methocarbamol  (ROBAXIN ) 500 MG tablet Take 1 tablet (500 mg total) by mouth 2 (two) times daily. 02/15/24  Yes Beverley Leita LABOR, PA-C  azithromycin  (ZITHROMAX ) 250 MG tablet Take first 2 tablets together, then 1 every day until finished. 09/17/23   White, Elizabeth A, PA-C  loperamide  (IMODIUM  A-D) 2 MG tablet Take 1 tablet (2 mg total) by mouth 4 (four) times daily as needed for diarrhea or loose stools. Patient not taking: Reported on 04/30/2023 02/12/22   Vivienne Delon HERO, PA-C  metroNIDAZOLE  (FLAGYL ) 500 MG tablet Take 1 tablet (500 mg total) by mouth 2 (two) times daily. Patient not taking: Reported on 04/30/2023 08/14/22   Eveline Lynwood MATSU, MD  naproxen  (NAPROSYN ) 500  MG tablet Take 1 tablet (500 mg total) by mouth 2 (two) times daily with a meal. Patient not taking: Reported on 04/30/2023 02/12/22   Vivienne Delon HERO, PA-C  ondansetron  (ZOFRAN -ODT) 4 MG disintegrating tablet Take 1 tablet (4 mg total) by mouth every 8 (eight) hours as needed for nausea or vomiting. Patient not taking: Reported on 04/30/2023 02/12/22   Vivienne Delon HERO, PA-C  Prenatal Vit-Fe Fumarate-FA (PRENATAL VITAMIN) 27-0.8 MG TABS Take 1 tablet by mouth daily. Patient not taking: Reported on 04/30/2023 03/19/22   Lola Donnice HERO, MD  SUMAtriptan (IMITREX) 50 MG tablet Take 1 tablet (50 mg total) by mouth once for 1 dose. May repeat once in 2 hours if headache persists or recurs. 02/14/24 02/14/24  Randol Simmonds, MD    Allergies: Penicillins    Review of Systems Negative except as per HPI Updated Vital Signs BP 130/88 (BP Location: Right Arm)   Pulse (!) 54   Temp 97.6 F (36.4 C) (Oral)   Resp 20   SpO2 98%   Physical Exam Vitals and nursing note reviewed.  Constitutional:      General: She is not in acute distress.    Appearance: She is well-developed. She is not diaphoretic.  HENT:     Head: Normocephalic and atraumatic.     Right Ear: Tympanic membrane and ear canal normal.     Left Ear: Tympanic membrane and ear canal normal.     Mouth/Throat:  Mouth: Mucous membranes are moist.  Eyes:     Extraocular Movements: Extraocular movements intact.     Pupils: Pupils are equal, round, and reactive to light.  Pulmonary:     Effort: Pulmonary effort is normal.  Musculoskeletal:     Cervical back: Neck supple.       Back:     Right lower leg: No edema.     Left lower leg: No edema.  Skin:    General: Skin is warm and dry.     Findings: No erythema or rash.  Neurological:     Mental Status: She is alert and oriented to person, place, and time.     Cranial Nerves: No cranial nerve deficit.     Sensory: No sensory deficit.     Motor: No weakness.     Coordination:  Coordination normal.     Gait: Gait normal.  Psychiatric:        Behavior: Behavior normal.     (all labs ordered are listed, but only abnormal results are displayed) Labs Reviewed - No data to display  EKG: None  Radiology: CT HEAD WO CONTRAST Result Date: 02/14/2024 EXAM: CT HEAD WITHOUT CONTRAST 02/14/2024 03:32:00 PM TECHNIQUE: CT of the head was performed without the administration of intravenous contrast. Automated exposure control, iterative reconstruction, and/or weight based adjustment of the mA/kV was utilized to reduce the radiation dose to as low as reasonably achievable. COMPARISON: None available. CLINICAL HISTORY: Headache, increasing frequency or severity. FINDINGS: BRAIN AND VENTRICLES: There is no evidence of an acute infarct, intracranial hemorrhage, mass, midline shift, hydrocephalus, or extra-axial fluid collection. Cerebral volume is normal. A partially empty sella is noted. There is no cerebellar tonsillar ectopia. ORBITS: No acute abnormality. SINUSES: No acute abnormality. SOFT TISSUES AND SKULL: No acute soft tissue abnormality. No skull fracture. IMPRESSION: 1. No acute intracranial abnormality. 2. Partially empty sella, often reflecting normal anatomic variation though can also be seen with idiopathic intracranial hypertension. Electronically signed by: Dasie Hamburg MD 02/14/2024 03:47 PM EST RP Workstation: [id number]     Procedures   Medications Ordered in the ED  oxyCODONE -acetaminophen  (PERCOCET/ROXICET) 5-325 MG per tablet 1 tablet (1 tablet Oral Given 02/15/24 0019)  methocarbamol  (ROBAXIN ) tablet 750 mg (has no administration in time range)  magnesium sulfate IVPB 2 g 50 mL (0 g Intravenous Stopped 02/15/24 0551)  dexamethasone (DECADRON) injection 10 mg (10 mg Intravenous Given 02/15/24 0406)  ketorolac  (TORADOL ) 15 MG/ML injection 15 mg (15 mg Intravenous Given 02/15/24 0405)  sodium chloride  0.9 % bolus 1,000 mL (0 mLs Intravenous Stopped 02/15/24 0551)                                     Medical Decision Making Risk Prescription drug management.   This patient presents to the ED for concern of headache, back pain, this involves an extensive number of treatment options, and is a complaint that carries with it a high risk of complications and morbidity.  The differential diagnosis includes but not limited to migraine, tension headache, muscle spasm    Co morbidities / Chronic conditions that complicate the patient evaluation  Asthma, HLD, depression, anxiety    Additional history obtained:  Additional history obtained from EMR External records from outside source obtained and reviewed including prior visit to the ER including CT head yesterday reviewed   Problem List / ED Course / Critical interventions / Medication management  28 year old female presents with ongoing headache, seen yesterday in the ER for same and provided with Toradol  and Reglan.  Reports ongoing headache with pain across her shoulders/posterior neck.  Neuroexam is unremarkable.  Patient provided with Percocet in triage, headache has improved however still has pain in the back of her neck which is reproduced with palpation.  She is provided with Toradol , Decadron, magnesium and IV fluids as well as Robaxin .  Pain is improving although still somewhat present.  She is discharged with prescription for Robaxin .  She can continue the Imitrex previously prescribed.  Referred to neurology for follow-up and advised return for worsening or concerning symptoms. I ordered medication including decadron, toradol , magnesium, robaxin , IVF   Reevaluation of the patient after these medicines showed that the patient headache improved I have reviewed the patients home medicines and have made adjustments as needed   Social Determinants of Health:  No PCP on file   Test / Admission - Considered:  Stable for dc      Final diagnoses:  Migraine without status migrainosus, not  intractable, unspecified migraine type    ED Discharge Orders          Ordered    methocarbamol  (ROBAXIN ) 500 MG tablet  2 times daily        02/15/24 0544               Beverley Leita LABOR, PA-C 02/15/24 0552    Theadore Ozell HERO, MD 02/15/24 [phone]

## 2024-02-15 NOTE — ED Triage Notes (Addendum)
 Pt arrives W/ [facility] w/ c/o migraine. Got an MRI at ED yesterday & found excess fluid around brain. Was told to come back to ED if it got worse & reports it has. C/o pressure behind eyes, sensitive to light & sounds. Also c/o nausea earlier but denies at this time. Reports she took 2 imitrex 3 hour ago.
